# Patient Record
Sex: Male | Born: 1940 | Race: Black or African American | Hispanic: No | Smoking: Current every day smoker
Health system: Southern US, Community
[De-identification: ages and names within clinical notes are randomized; demographics above are authoritative.]

## PROBLEM LIST (undated history)

## (undated) DIAGNOSIS — M109 Gout, unspecified: Secondary | ICD-10-CM

## (undated) DIAGNOSIS — F1020 Alcohol dependence, uncomplicated: Secondary | ICD-10-CM

## (undated) DIAGNOSIS — I10 Essential (primary) hypertension: Secondary | ICD-10-CM

## (undated) DIAGNOSIS — N289 Disorder of kidney and ureter, unspecified: Secondary | ICD-10-CM

## (undated) HISTORY — DX: Disorder of kidney and ureter, unspecified: N28.9

## (undated) HISTORY — DX: Alcohol dependence, uncomplicated: F10.20

## (undated) HISTORY — DX: Essential (primary) hypertension: I10

---

## 2008-06-30 ENCOUNTER — Emergency Department (HOSPITAL_COMMUNITY): Admission: EM | Admit: 2008-06-30 | Discharge: 2008-06-30 | Payer: Self-pay | Admitting: Emergency Medicine

## 2011-09-01 DIAGNOSIS — M109 Gout, unspecified: Secondary | ICD-10-CM | POA: Insufficient documentation

## 2011-09-01 DIAGNOSIS — F17200 Nicotine dependence, unspecified, uncomplicated: Secondary | ICD-10-CM | POA: Insufficient documentation

## 2011-09-02 ENCOUNTER — Emergency Department (HOSPITAL_COMMUNITY)
Admission: RE | Admit: 2011-09-02 | Discharge: 2011-09-02 | Disposition: A | Discharge status: Home / Self Care | Payer: Medicare Other | Source: Ambulatory Visit | Attending: Emergency Medicine | Admitting: Emergency Medicine

## 2011-09-02 ENCOUNTER — Emergency Department (HOSPITAL_COMMUNITY)
Admission: EM | Admit: 2011-09-02 | Discharge: 2011-09-02 | Disposition: A | Discharge status: Home / Self Care | Payer: Medicare Other | Attending: Emergency Medicine | Admitting: Emergency Medicine

## 2011-09-02 ENCOUNTER — Other Ambulatory Visit (HOSPITAL_COMMUNITY): Payer: Self-pay | Admitting: Emergency Medicine

## 2011-09-02 DIAGNOSIS — R52 Pain, unspecified: Secondary | ICD-10-CM

## 2011-09-02 LAB — CBC WITH DIFFERENTIAL/PLATELET
Basophils Absolute: 0.1 10*3/uL (ref 0.0–0.1)
Basophils Relative: 1 % (ref 0–1)
Eosinophils Absolute: 0.3 10*3/uL (ref 0.0–0.7)
Hemoglobin: 15.2 g/dL (ref 13.0–17.0)
MCH: 28 pg (ref 26.0–34.0)
MCHC: 34.5 g/dL (ref 30.0–36.0)
Monocytes Absolute: 0.4 10*3/uL (ref 0.1–1.0)
Monocytes Relative: 5 % (ref 3–12)
Neutrophils Relative %: 72 % (ref 43–77)

## 2011-09-02 LAB — URIC ACID: Uric Acid, Serum: 8.1 mg/dL — ABNORMAL HIGH (ref 4.0–7.8)

## 2011-09-02 MED FILL — Ketorolac Tromethamine IM Inj 60 MG/2ML (30 MG/ML): INTRAMUSCULAR | Qty: 2 | Status: AC

## 2011-09-02 NOTE — ED Notes (Signed)
Refer to downtime documentation.

## 2012-04-04 ENCOUNTER — Emergency Department (HOSPITAL_COMMUNITY): Payer: Medicare Other

## 2012-04-04 ENCOUNTER — Emergency Department (HOSPITAL_COMMUNITY)
Admission: EM | Admit: 2012-04-04 | Discharge: 2012-04-04 | Disposition: A | Discharge status: Home / Self Care | Payer: Medicare Other | Attending: Emergency Medicine | Admitting: Emergency Medicine

## 2012-04-04 ENCOUNTER — Encounter (HOSPITAL_COMMUNITY): Payer: Self-pay | Admitting: *Deleted

## 2012-04-04 DIAGNOSIS — M109 Gout, unspecified: Secondary | ICD-10-CM | POA: Insufficient documentation

## 2012-04-04 DIAGNOSIS — F17200 Nicotine dependence, unspecified, uncomplicated: Secondary | ICD-10-CM | POA: Insufficient documentation

## 2012-04-04 DIAGNOSIS — IMO0001 Reserved for inherently not codable concepts without codable children: Secondary | ICD-10-CM | POA: Insufficient documentation

## 2012-04-04 DIAGNOSIS — M25539 Pain in unspecified wrist: Secondary | ICD-10-CM

## 2012-04-04 HISTORY — DX: Gout, unspecified: M10.9

## 2012-04-04 LAB — BASIC METABOLIC PANEL
BUN: 7 mg/dL (ref 6–23)
Chloride: 91 mEq/L — ABNORMAL LOW (ref 96–112)
GFR calc Af Amer: 87 mL/min — ABNORMAL LOW (ref >90)
GFR calc non Af Amer: 75 mL/min — ABNORMAL LOW (ref >90)
Potassium: 4.1 mEq/L (ref 3.5–5.1)
Sodium: 130 mEq/L — ABNORMAL LOW (ref 135–145)

## 2012-04-04 LAB — CBC
HCT: 43.3 % (ref 39.0–52.0)
Hemoglobin: 15.2 g/dL (ref 13.0–17.0)
RBC: 5.21 MIL/uL (ref 4.22–5.81)
WBC: 7.8 10*3/uL (ref 4.0–10.5)

## 2012-04-04 MED ORDER — OXYCODONE-ACETAMINOPHEN 5-325 MG PO TABS
2.0000 | ORAL_TABLET | Freq: Once | ORAL | Status: AC
  Administered 2012-04-04: 2 via ORAL
  Filled 2012-04-04: qty 2

## 2012-04-04 MED ORDER — OXYCODONE-ACETAMINOPHEN 5-325 MG PO TABS: 2.0000 | ORAL_TABLET | ORAL | Status: DC | PRN

## 2012-04-04 MED ORDER — IBUPROFEN 800 MG PO TABS: 800.0000 mg | ORAL_TABLET | Freq: Three times a day (TID) | ORAL | Status: DC

## 2012-04-04 NOTE — ED Notes (Signed)
Patient with no complaints at this time. Respirations even and unlabored. Skin warm/dry. Discharge instructions reviewed with patient at this time. Patient given opportunity to voice concerns/ask questions. Patient discharged at this time and left Emergency Department with steady gait.   

## 2012-04-04 NOTE — ED Notes (Signed)
Pt c/o right wrist pain that started a few days ago, denies any injury,. Has hx of gout,

## 2012-04-04 NOTE — ED Provider Notes (Signed)
History     CSN: [id number]  Arrival date & time 04/04/12  1336   First MD Initiated Contact with Patient 04/04/12 1355      Chief Complaint  Patient presents with  . Wrist Pain    (Consider location/radiation/quality/duration/timing/severity/associated sxs/prior treatment) HPI Comments: Patient complains of pain in his dorsal right wrist for the past several days. States he hit it on a table a few days ago but did not start hurting until last night. His pain with range of motion of the wrist. Denies any fever, chills, nausea, vomiting, weakness, numbness or tingling. Is a questionable history of gout. Denies any history of hypertension that is significantly hypertensive today. Denies any other joint pain. Denies any chest pain, shortness of breath, nausea vomiting.  The history is provided by the patient.    Past Medical History  Diagnosis Date  . Gout     History reviewed. No pertinent past surgical history.  No family history on file.  History  Substance Use Topics  . Smoking status: Current Every Day Smoker  . Smokeless tobacco: Not on file  . Alcohol Use: No      Review of Systems  Constitutional: Negative for fever, activity change and appetite change.  HENT: Negative for congestion and rhinorrhea.   Respiratory: Negative for cough, chest tightness and shortness of breath.   Cardiovascular: Negative for chest pain.  Gastrointestinal: Negative for nausea, vomiting and abdominal pain.  Genitourinary: Negative for dysuria.  Musculoskeletal: Positive for myalgias and arthralgias.  Skin: Negative for pallor.  Neurological: Negative for dizziness and headaches.  A complete 10 system review of systems was obtained and all systems are negative except as noted in the HPI and PMH.    Allergies  Review of patient's allergies indicates no known allergies.  Home Medications   Current Outpatient Rx  Name  Route  Sig  Dispense  Refill  . Aspirin-Acetaminophen (GOODY  BODY PAIN) 500-325 MG PACK   Oral   Take 1 packet by mouth daily as needed (pain).         Marland Kitchen ibuprofen (ADVIL,MOTRIN) 800 MG tablet   Oral   Take 1 tablet (800 mg total) by mouth 3 (three) times daily.   21 tablet   0   . oxyCODONE-acetaminophen (PERCOCET/ROXICET) 5-325 MG per tablet   Oral   Take 2 tablets by mouth every 4 (four) hours as needed for pain.   15 tablet   0     BP 161/91  Pulse 74  Temp(Src) 98.2 F (36.8 C)  Resp 17  Ht 5\' 10"  (1.778 m)  Wt 160 lb (72.576 kg)  BMI 22.96 kg/m2  SpO2 96%  Physical Exam  Constitutional: He is oriented to person, place, and time. He appears well-developed and well-nourished. No distress.  HENT:  Head: Normocephalic and atraumatic.  Mouth/Throat: Oropharynx is clear and moist. No oropharyngeal exudate.  Eyes: Conjunctivae and EOM are normal. Pupils are equal, round, and reactive to light.  Neck: Normal range of motion. Neck supple.  Cardiovascular: Normal rate, regular rhythm and normal heart sounds.   Pulmonary/Chest: Effort normal and breath sounds normal. No respiratory distress.  Abdominal: Soft. There is no tenderness. There is no rebound and no guarding.  Musculoskeletal: Normal range of motion. He exhibits tenderness.  Mild erythema and swelling to right dorsal wrist. Pain with extension and flexion. +2 radial pulse, cardinal hand movements are intact. Mild warmth.  Neurological: He is alert and oriented to person, place, and time. No  cranial nerve deficit. He exhibits normal muscle tone. Coordination normal.  Skin: Skin is warm.    ED Course  Procedures (including critical care time)  Labs Reviewed  BASIC METABOLIC PANEL - Abnormal; Notable for the following:    Sodium 130 (*)    Chloride 91 (*)    Glucose, Bld 132 (*)    GFR calc non Af Amer 75 (*)    GFR calc Af Amer 87 (*)    All other components within normal limits  URIC ACID  CBC   Dg Wrist Complete Right  04/04/2012  *RADIOLOGY REPORT*  Clinical  Data: Right wrist pain.  RIGHT WRIST - COMPLETE 3+ VIEW  Comparison: 06/30/2008  Findings: No acute fracture or dislocation is identified. 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  No soft tissue abnormalities are identified.  IMPRESSION: No acute fracture.  Stable degenerative changes of the wrist.   Original Report Authenticated By: Irish Lack, M.D.      1. Wrist pain   2. Gout       MDM  Right wrist pain and swelling with warmth. Suspect gout. Afebrile.  Range of motion improved after pain medication. Blood pressure improved to 160/90 on recheck. X-ray shows degenerative changes.  Explained that gout or septic joint can only be diagnosed with joint aspiration. He declines this and says he is feeling better. I do not suspect septic joint as patient has good range of motion of the wrist it is not hot it is warm and somewhat red. I explained we have not rule out septic joint but it remains a slight possibility. He'll return with worsening symptoms. Given referral to orthopedics.      Glynn Octave, MD 04/04/12 [phone]

## 2015-03-16 ENCOUNTER — Encounter (HOSPITAL_COMMUNITY): Payer: Self-pay | Admitting: Emergency Medicine

## 2015-03-16 ENCOUNTER — Emergency Department (HOSPITAL_COMMUNITY): Payer: Medicare Other

## 2015-03-16 ENCOUNTER — Emergency Department (HOSPITAL_COMMUNITY)
Admission: EM | Admit: 2015-03-16 | Discharge: 2015-03-16 | Disposition: A | Discharge status: Home / Self Care | Payer: Medicare Other | Attending: Emergency Medicine | Admitting: Emergency Medicine

## 2015-03-16 DIAGNOSIS — Z8739 Personal history of other diseases of the musculoskeletal system and connective tissue: Secondary | ICD-10-CM | POA: Insufficient documentation

## 2015-03-16 DIAGNOSIS — R531 Weakness: Secondary | ICD-10-CM | POA: Insufficient documentation

## 2015-03-16 DIAGNOSIS — S199XXA Unspecified injury of neck, initial encounter: Secondary | ICD-10-CM | POA: Diagnosis not present

## 2015-03-16 DIAGNOSIS — R404 Transient alteration of awareness: Secondary | ICD-10-CM | POA: Diagnosis not present

## 2015-03-16 DIAGNOSIS — R296 Repeated falls: Secondary | ICD-10-CM | POA: Diagnosis not present

## 2015-03-16 DIAGNOSIS — Y998 Other external cause status: Secondary | ICD-10-CM | POA: Diagnosis not present

## 2015-03-16 DIAGNOSIS — S80212A Abrasion, left knee, initial encounter: Secondary | ICD-10-CM | POA: Diagnosis not present

## 2015-03-16 DIAGNOSIS — W07XXXA Fall from chair, initial encounter: Secondary | ICD-10-CM | POA: Diagnosis not present

## 2015-03-16 DIAGNOSIS — Z23 Encounter for immunization: Secondary | ICD-10-CM | POA: Diagnosis not present

## 2015-03-16 DIAGNOSIS — S80211A Abrasion, right knee, initial encounter: Secondary | ICD-10-CM | POA: Diagnosis not present

## 2015-03-16 DIAGNOSIS — Z7982 Long term (current) use of aspirin: Secondary | ICD-10-CM | POA: Insufficient documentation

## 2015-03-16 DIAGNOSIS — L409 Psoriasis, unspecified: Secondary | ICD-10-CM | POA: Diagnosis not present

## 2015-03-16 DIAGNOSIS — Y9389 Activity, other specified: Secondary | ICD-10-CM | POA: Insufficient documentation

## 2015-03-16 DIAGNOSIS — W19XXXA Unspecified fall, initial encounter: Secondary | ICD-10-CM

## 2015-03-16 DIAGNOSIS — F1010 Alcohol abuse, uncomplicated: Secondary | ICD-10-CM | POA: Diagnosis not present

## 2015-03-16 DIAGNOSIS — Y9289 Other specified places as the place of occurrence of the external cause: Secondary | ICD-10-CM | POA: Insufficient documentation

## 2015-03-16 DIAGNOSIS — F17210 Nicotine dependence, cigarettes, uncomplicated: Secondary | ICD-10-CM | POA: Insufficient documentation

## 2015-03-16 DIAGNOSIS — S8991XA Unspecified injury of right lower leg, initial encounter: Secondary | ICD-10-CM | POA: Diagnosis present

## 2015-03-16 DIAGNOSIS — S0990XA Unspecified injury of head, initial encounter: Secondary | ICD-10-CM | POA: Diagnosis not present

## 2015-03-16 LAB — COMPREHENSIVE METABOLIC PANEL
ALBUMIN: 3 g/dL — AB (ref 3.5–5.0)
ALK PHOS: 72 U/L (ref 38–126)
ALT: 18 U/L (ref 17–63)
AST: 43 U/L — ABNORMAL HIGH (ref 15–41)
Anion gap: 9 (ref 5–15)
BUN: 10 mg/dL (ref 6–20)
CALCIUM: 8.5 mg/dL — AB (ref 8.9–10.3)
CO2: 24 mmol/L (ref 22–32)
CREATININE: 1.01 mg/dL (ref 0.61–1.24)
Chloride: 95 mmol/L — ABNORMAL LOW (ref 101–111)
GFR calc Af Amer: >60 mL/min (ref >60)
GFR calc non Af Amer: >60 mL/min (ref >60)
GLUCOSE: 85 mg/dL (ref 65–99)
Potassium: 4.4 mmol/L (ref 3.5–5.1)
SODIUM: 128 mmol/L — AB (ref 135–145)
Total Bilirubin: 0.5 mg/dL (ref 0.3–1.2)
Total Protein: 8.3 g/dL — ABNORMAL HIGH (ref 6.5–8.1)

## 2015-03-16 LAB — URINALYSIS, ROUTINE W REFLEX MICROSCOPIC
BILIRUBIN URINE: NEGATIVE
GLUCOSE, UA: NEGATIVE mg/dL
KETONES UR: NEGATIVE mg/dL
Leukocytes, UA: NEGATIVE
Nitrite: NEGATIVE
PH: 5.5 (ref 5.0–8.0)
Protein, ur: NEGATIVE mg/dL
Specific Gravity, Urine: <1.005 — ABNORMAL LOW (ref 1.005–1.030)

## 2015-03-16 LAB — CBC WITH DIFFERENTIAL/PLATELET
BASOS PCT: 2 %
Basophils Absolute: 0.1 10*3/uL (ref 0.0–0.1)
Eosinophils Absolute: 0.2 10*3/uL (ref 0.0–0.7)
Eosinophils Relative: 5 %
HEMATOCRIT: 34.5 % — AB (ref 39.0–52.0)
HEMOGLOBIN: 11.9 g/dL — AB (ref 13.0–17.0)
Lymphocytes Relative: 38 %
Lymphs Abs: 1.9 10*3/uL (ref 0.7–4.0)
MCH: 29.5 pg (ref 26.0–34.0)
MCHC: 34.5 g/dL (ref 30.0–36.0)
MCV: 85.6 fL (ref 78.0–100.0)
MONOS PCT: 4 %
Monocytes Absolute: 0.2 10*3/uL (ref 0.1–1.0)
NEUTROS ABS: 2.6 10*3/uL (ref 1.7–7.7)
NEUTROS PCT: 51 %
Platelets: 187 10*3/uL (ref 150–400)
RBC: 4.03 MIL/uL — ABNORMAL LOW (ref 4.22–5.81)
RDW: 14.5 % (ref 11.5–15.5)
WBC: 5 10*3/uL (ref 4.0–10.5)

## 2015-03-16 LAB — URINE MICROSCOPIC-ADD ON

## 2015-03-16 LAB — ETHANOL: Alcohol, Ethyl (B): 257 mg/dL — ABNORMAL HIGH (ref <5)

## 2015-03-16 MED ORDER — TETANUS-DIPHTH-ACELL PERTUSSIS 5-2.5-18.5 LF-MCG/0.5 IM SUSP
0.5000 mL | Freq: Once | INTRAMUSCULAR | Status: AC
  Administered 2015-03-16: 0.5 mL via INTRAMUSCULAR
  Filled 2015-03-16: qty 0.5

## 2015-03-16 MED ORDER — SODIUM CHLORIDE 0.9 % IV BOLUS (SEPSIS)
1000.0000 mL | Freq: Once | INTRAVENOUS | Status: AC
Start: 2015-03-16 — End: 2015-03-16
  Administered 2015-03-16: 1000 mL via INTRAVENOUS

## 2015-03-16 MED ORDER — CHLORDIAZEPOXIDE HCL 25 MG PO CAPS: ORAL_CAPSULE | ORAL | Status: DC

## 2015-03-16 NOTE — ED Notes (Signed)
Pt got out of bed without calling and walked to restroom, pt. Urinated on floor and slipped up on urine walking out of bathroom. Pt. Was witnessed to be sitting on floor after fall, denies hitting head or any pain. VSS. EDP notifed. Bed alarm applied to bed, yellow socks and yellow armband applied. Family at bedside currently.

## 2015-03-16 NOTE — ED Notes (Signed)
Pt notified of need of urine specimen. Urinal at bedside. Patient instructed to call.

## 2015-03-16 NOTE — ED Notes (Signed)
Pt brought by ems for evaluation of intoxication and a fall.  CBG 94 per ems.  Pt drinks daily and lives alone and has been falling frequently.  Has multiple sores on body that resemble burns.  Smells of urine.

## 2015-03-16 NOTE — ED Provider Notes (Signed)
CSN: [id number]     Arrival date & time 03/16/15  1035 History  By signing my name below, I, Terressa Koyanagi, attest that this documentation has been prepared under the direction and in the presence of Milton Ferguson, MD. Electronically Signed: Terressa Koyanagi, ED Scribe. 03/16/2015. 11:09 AM.  Chief Complaint  Patient presents with  . Alcohol Intoxication  . Fall   Patient is a 75 y.o. male presenting with fall. The history is provided by the patient. No language interpreter was used.  Fall This is a new problem. The current episode started 1 to 2 hours ago. Pertinent negatives include no chest pain, no abdominal pain, no headaches and no shortness of breath.   PCP: No primary care provider on file. HPI Comments: Anthony Burton is a 75 y.o. male, with PMHx noted below including daily EtOH use, accompanied by his niece, who presents to the Emergency Department via EMS complaining of a fall earlier today whereby pt fell off a chair onto the floor, hitting his head. Pt denies LOC. Pt also complains of intermittent, generalized weakness onset months ago.   Past Medical History  Diagnosis Date  . Gout    History reviewed. No pertinent past surgical history. History reviewed. No pertinent family history. Social History  Substance Use Topics  . Smoking status: Current Every Day Smoker    Types: Cigarettes  . Smokeless tobacco: None  . Alcohol Use: Yes     Comment: daily use    Review of Systems  Constitutional: Negative for appetite change and fatigue.  HENT: Negative for congestion, ear discharge and sinus pressure.   Eyes: Negative for discharge.  Respiratory: Negative for cough and shortness of breath.   Cardiovascular: Negative for chest pain.  Gastrointestinal: Negative for abdominal pain and diarrhea.  Genitourinary: Negative for frequency and hematuria.  Musculoskeletal: Negative for back pain.  Skin: Positive for wound (abrasion to bilateral knees). Negative for rash.   Neurological: Positive for weakness. Negative for seizures and headaches.  Psychiatric/Behavioral: Negative for hallucinations.   Allergies  Review of patient's allergies indicates no known allergies.  Home Medications   Prior to Admission medications   Medication Sig Start Date End Date Taking? Authorizing Provider  aspirin 325 MG tablet Take 650 mg by mouth daily.   Yes Historical Provider, MD  Aspirin-Acetaminophen (GOODY BODY PAIN) 500-325 MG PACK Take 1 packet by mouth daily as needed (pain).   Yes Historical Provider, MD   Triage Vitals: BP 152/97 mmHg  Pulse 73  Temp(Src) 97.6 F (36.4 C) (Oral)  Resp 18  Ht 6' (1.829 m)  Wt 160 lb (72.576 kg)  BMI 21.70 kg/m2  SpO2 100% Physical Exam  Constitutional: He is oriented to person, place, and time. He appears well-developed.  HENT:  Head: Normocephalic.  Eyes: Conjunctivae and EOM are normal. No scleral icterus.  Neck: Neck supple. No thyromegaly present.  Cardiovascular: Normal rate and regular rhythm.  Exam reveals no gallop and no friction rub.   No murmur heard. Pulmonary/Chest: No stridor. He has no wheezes. He has no rales. He exhibits no tenderness.  Abdominal: He exhibits no distension. There is no tenderness. There is no rebound.  Musculoskeletal: Normal range of motion. He exhibits no edema.  Lymphadenopathy:    He has no cervical adenopathy.  Neurological: He is oriented to person, place, and time. He exhibits normal muscle tone. Coordination normal.  Skin:  psoriasis bilateral arms, abrasion to bilateral knees.   Psychiatric: He has a normal mood and  affect. His behavior is normal.    ED Course  Procedures (including critical care time) DIAGNOSTIC STUDIES: Oxygen Saturation is 100% on ra, nl by my interpretation.    COORDINATION OF CARE: 11:04 AM: Discussed treatment plan with pt at bedside; patient verbalizes understanding and agrees with treatment plan.  Labs Review Labs Reviewed - No data to  display  Imaging Review No results found. I have personally reviewed and evaluated these images and lab results as part of my medical decision-making.   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 Patient was instructed to stop drinking alcohol. He is given a prescription of Librium. Is also referred to [facility] his 2 relatives with [facility] and agreed they would help take care of him   The chart was scribed for me under my direct supervision.  I personally performed the history, physical, and medical decision making and all procedures in the evaluation of this patient.Milton Ferguson, MD 03/16/15 1435

## 2015-03-16 NOTE — Discharge Instructions (Signed)
Follow up with [facility] next week.  Stop drinking alcohol

## 2015-03-16 NOTE — ED Notes (Signed)
Pt d/c papers/prescriptions given and reviewed with patient and family. Patient/family verbalized understanding. Patient d/c via wheelchair in care of family.

## 2015-10-31 DIAGNOSIS — E669 Obesity, unspecified: Secondary | ICD-10-CM | POA: Diagnosis not present

## 2015-10-31 DIAGNOSIS — M109 Gout, unspecified: Secondary | ICD-10-CM | POA: Diagnosis not present

## 2015-10-31 DIAGNOSIS — R799 Abnormal finding of blood chemistry, unspecified: Secondary | ICD-10-CM | POA: Diagnosis not present

## 2015-10-31 DIAGNOSIS — L989 Disorder of the skin and subcutaneous tissue, unspecified: Secondary | ICD-10-CM | POA: Diagnosis not present

## 2015-10-31 DIAGNOSIS — Z79899 Other long term (current) drug therapy: Secondary | ICD-10-CM | POA: Diagnosis not present

## 2015-10-31 DIAGNOSIS — I10 Essential (primary) hypertension: Secondary | ICD-10-CM | POA: Diagnosis not present

## 2015-10-31 DIAGNOSIS — Z0000 Encounter for general adult medical examination without abnormal findings: Secondary | ICD-10-CM | POA: Diagnosis not present

## 2015-11-08 DIAGNOSIS — L120 Bullous pemphigoid: Secondary | ICD-10-CM | POA: Diagnosis not present

## 2015-11-14 DIAGNOSIS — F17200 Nicotine dependence, unspecified, uncomplicated: Secondary | ICD-10-CM | POA: Diagnosis not present

## 2015-11-14 DIAGNOSIS — M109 Gout, unspecified: Secondary | ICD-10-CM | POA: Diagnosis not present

## 2015-11-14 DIAGNOSIS — I10 Essential (primary) hypertension: Secondary | ICD-10-CM | POA: Diagnosis not present

## 2015-11-14 DIAGNOSIS — L120 Bullous pemphigoid: Secondary | ICD-10-CM | POA: Diagnosis not present

## 2015-11-14 DIAGNOSIS — Z23 Encounter for immunization: Secondary | ICD-10-CM | POA: Diagnosis not present

## 2017-03-16 ENCOUNTER — Inpatient Hospital Stay (HOSPITAL_COMMUNITY)
Admission: EM | Admit: 2017-03-16 | Discharge: 2017-03-20 | DRG: 641 | Disposition: A | Discharge status: Home / Self Care | Payer: Medicare Other | Attending: Family Medicine | Admitting: Family Medicine

## 2017-03-16 ENCOUNTER — Emergency Department (HOSPITAL_COMMUNITY): Payer: Medicare Other

## 2017-03-16 ENCOUNTER — Encounter (HOSPITAL_COMMUNITY): Payer: Self-pay | Admitting: Emergency Medicine

## 2017-03-16 DIAGNOSIS — R404 Transient alteration of awareness: Secondary | ICD-10-CM | POA: Diagnosis not present

## 2017-03-16 DIAGNOSIS — S0990XA Unspecified injury of head, initial encounter: Secondary | ICD-10-CM | POA: Diagnosis not present

## 2017-03-16 DIAGNOSIS — E871 Hypo-osmolality and hyponatremia: Secondary | ICD-10-CM | POA: Diagnosis not present

## 2017-03-16 DIAGNOSIS — S065X9A Traumatic subdural hemorrhage with loss of consciousness of unspecified duration, initial encounter: Secondary | ICD-10-CM

## 2017-03-16 DIAGNOSIS — R918 Other nonspecific abnormal finding of lung field: Secondary | ICD-10-CM | POA: Diagnosis not present

## 2017-03-16 DIAGNOSIS — F1027 Alcohol dependence with alcohol-induced persisting dementia: Secondary | ICD-10-CM | POA: Diagnosis present

## 2017-03-16 DIAGNOSIS — R7989 Other specified abnormal findings of blood chemistry: Secondary | ICD-10-CM

## 2017-03-16 DIAGNOSIS — N390 Urinary tract infection, site not specified: Secondary | ICD-10-CM | POA: Diagnosis present

## 2017-03-16 DIAGNOSIS — E860 Dehydration: Secondary | ICD-10-CM | POA: Diagnosis present

## 2017-03-16 DIAGNOSIS — Z23 Encounter for immunization: Secondary | ICD-10-CM

## 2017-03-16 DIAGNOSIS — J320 Chronic maxillary sinusitis: Secondary | ICD-10-CM

## 2017-03-16 DIAGNOSIS — R531 Weakness: Secondary | ICD-10-CM | POA: Diagnosis not present

## 2017-03-16 DIAGNOSIS — Z452 Encounter for adjustment and management of vascular access device: Secondary | ICD-10-CM

## 2017-03-16 DIAGNOSIS — Z515 Encounter for palliative care: Secondary | ICD-10-CM

## 2017-03-16 DIAGNOSIS — D649 Anemia, unspecified: Secondary | ICD-10-CM | POA: Diagnosis present

## 2017-03-16 DIAGNOSIS — M109 Gout, unspecified: Secondary | ICD-10-CM

## 2017-03-16 DIAGNOSIS — F0390 Unspecified dementia without behavioral disturbance: Secondary | ICD-10-CM | POA: Diagnosis present

## 2017-03-16 DIAGNOSIS — E876 Hypokalemia: Secondary | ICD-10-CM | POA: Diagnosis not present

## 2017-03-16 DIAGNOSIS — S065XAA Traumatic subdural hemorrhage with loss of consciousness status unknown, initial encounter: Secondary | ICD-10-CM

## 2017-03-16 DIAGNOSIS — F17210 Nicotine dependence, cigarettes, uncomplicated: Secondary | ICD-10-CM | POA: Diagnosis present

## 2017-03-16 DIAGNOSIS — Z95828 Presence of other vascular implants and grafts: Secondary | ICD-10-CM

## 2017-03-16 DIAGNOSIS — R296 Repeated falls: Secondary | ICD-10-CM

## 2017-03-16 DIAGNOSIS — Z7982 Long term (current) use of aspirin: Secondary | ICD-10-CM

## 2017-03-16 DIAGNOSIS — R778 Other specified abnormalities of plasma proteins: Secondary | ICD-10-CM

## 2017-03-16 DIAGNOSIS — Z681 Body mass index (BMI) 19 or less, adult: Secondary | ICD-10-CM

## 2017-03-16 DIAGNOSIS — E440 Moderate protein-calorie malnutrition: Secondary | ICD-10-CM

## 2017-03-16 DIAGNOSIS — E162 Hypoglycemia, unspecified: Secondary | ICD-10-CM | POA: Diagnosis not present

## 2017-03-16 DIAGNOSIS — R64 Cachexia: Secondary | ICD-10-CM | POA: Diagnosis present

## 2017-03-16 DIAGNOSIS — W19XXXA Unspecified fall, initial encounter: Secondary | ICD-10-CM

## 2017-03-16 DIAGNOSIS — F1010 Alcohol abuse, uncomplicated: Secondary | ICD-10-CM

## 2017-03-16 DIAGNOSIS — S0093XS Contusion of unspecified part of head, sequela: Secondary | ICD-10-CM

## 2017-03-16 LAB — TROPONIN I: Troponin I: 0.11 ng/mL (ref <0.03)

## 2017-03-16 LAB — COMPREHENSIVE METABOLIC PANEL
ALBUMIN: 3.4 g/dL — AB (ref 3.5–5.0)
ALT: 19 U/L (ref 17–63)
AST: 93 U/L — AB (ref 15–41)
Alkaline Phosphatase: 73 U/L (ref 38–126)
Anion gap: 13 (ref 5–15)
BUN: 14 mg/dL (ref 6–20)
CHLORIDE: 74 mmol/L — AB (ref 101–111)
CO2: 23 mmol/L (ref 22–32)
CREATININE: 1.18 mg/dL (ref 0.61–1.24)
Calcium: 8.9 mg/dL (ref 8.9–10.3)
GFR calc non Af Amer: 59 mL/min — ABNORMAL LOW (ref >60)
GLUCOSE: 63 mg/dL — AB (ref 65–99)
Potassium: 4.9 mmol/L (ref 3.5–5.1)
SODIUM: 110 mmol/L — AB (ref 135–145)
Total Bilirubin: 1.5 mg/dL — ABNORMAL HIGH (ref 0.3–1.2)
Total Protein: 7.3 g/dL (ref 6.5–8.1)

## 2017-03-16 LAB — TSH: TSH: 1.411 u[IU]/mL (ref 0.350–4.500)

## 2017-03-16 LAB — CBG MONITORING, ED
GLUCOSE-CAPILLARY: 96 mg/dL (ref 65–99)
Glucose-Capillary: 57 mg/dL — ABNORMAL LOW (ref 65–99)

## 2017-03-16 LAB — CBC WITH DIFFERENTIAL/PLATELET
Basophils Absolute: 0 10*3/uL (ref 0.0–0.1)
Basophils Relative: 0 %
EOS PCT: 0 %
Eosinophils Absolute: 0 10*3/uL (ref 0.0–0.7)
HCT: 32 % — ABNORMAL LOW (ref 39.0–52.0)
Hemoglobin: 11.3 g/dL — ABNORMAL LOW (ref 13.0–17.0)
LYMPHS ABS: 0.9 10*3/uL (ref 0.7–4.0)
LYMPHS PCT: 14 %
MCH: 29 pg (ref 26.0–34.0)
MCHC: 35.3 g/dL (ref 30.0–36.0)
MCV: 82.3 fL (ref 78.0–100.0)
MONOS PCT: 9 %
Monocytes Absolute: 0.6 10*3/uL (ref 0.1–1.0)
Neutro Abs: 4.9 10*3/uL (ref 1.7–7.7)
Neutrophils Relative %: 77 %
PLATELETS: 161 10*3/uL (ref 150–400)
RBC: 3.89 MIL/uL — AB (ref 4.22–5.81)
RDW: 14.1 % (ref 11.5–15.5)
WBC: 6.4 10*3/uL (ref 4.0–10.5)

## 2017-03-16 LAB — ETHANOL: Alcohol, Ethyl (B): <10 mg/dL (ref <10)

## 2017-03-16 NOTE — ED Notes (Signed)
Troponin 0.11. EDP notified.

## 2017-03-16 NOTE — ED Provider Notes (Signed)
[facility] EMERGENCY DEPARTMENT Provider Note   CSN: [id number] Arrival date & time: 03/16/17  2134     History   Chief Complaint Chief Complaint  Patient presents with  . Fall   Level 5 caveat due to altered mental status. HPI Anthony Burton is a 77 y.o. male.  HPI Patient presents with fall and generalized weakness.  Reportedly was too weak to be able to get up on his own.  Patient lives alone and reportedly been doing worse recently.  Per family is a heavy drinker.  Patient states he drinks occasionally.  Covered in feces and urine.Reportedly has had decreased oral intake.  Denies cough.  Denies dysuria.  Per family members has had a weight loss.  Patient is somewhat difficult to get a history from.  At this apparent mild confusion. Past Medical History:  Diagnosis Date  . Gout     There are no active problems to display for this patient.   History reviewed. No pertinent surgical history.     Home Medications    Prior to Admission medications   Medication Sig Start Date End Date Taking? Authorizing Provider  aspirin 325 MG tablet Take 650 mg by mouth daily.    [provider]  Aspirin-Acetaminophen (GOODY BODY PAIN) 500-325 MG PACK Take 1 packet by mouth daily as needed (pain).    [provider]  chlordiazePOXIDE (LIBRIUM) 25 MG capsule 1 po every 8 hours for anxiety 03/16/15   Milton Ferguson, MD    Family History No family history on file.  Social History Social History   Tobacco Use  . Smoking status: Current Every Day Smoker    Types: Cigarettes  . Smokeless tobacco: Never Used  Substance Use Topics  . Alcohol use: Yes    Comment: daily use  . Drug use: No     Allergies   Patient has no known allergies.   Review of Systems Review of Systems  Unable to perform ROS: Mental status change  Constitutional: Positive for appetite change.  Respiratory: Negative for cough and shortness of breath.      Physical Exam Updated Vital  Signs BP 129/85   Pulse (!) 102   Temp 99.1 F (37.3 C) (Rectal)   Resp 19   SpO2 99%   Physical Exam  Constitutional: He appears well-developed.  Patient smells like feces.  HENT:  Head: Normocephalic and atraumatic.  Eyes: EOM are normal.  Neck: Neck supple.  Pulmonary/Chest: He has no wheezes. He has no rales.  Abdominal: There is no tenderness.  Musculoskeletal: He exhibits no edema.  Neurological: He is alert.  Awake and able to answer most questions but does have some mild confusion.  Skin: Skin is warm. Capillary refill takes less than 2 seconds.     ED Treatments / Results  Labs (all labs ordered are listed, but only abnormal results are displayed) Labs Reviewed  COMPREHENSIVE METABOLIC PANEL - Abnormal; Notable for the following components:      Result Value   Sodium 110 (*)    Chloride 74 (*)    Glucose, Bld 63 (*)    Albumin 3.4 (*)    AST 93 (*)    Total Bilirubin 1.5 (*)    GFR calc non Af Amer 59 (*)    All other components within normal limits  CBC WITH DIFFERENTIAL/PLATELET - Abnormal; Notable for the following components:   RBC 3.89 (*)    Hemoglobin 11.3 (*)    HCT 32.0 (*)  All other components within normal limits  TROPONIN I - Abnormal; Notable for the following components:   Troponin I 0.11 (*)    All other components within normal limits  CBG MONITORING, ED - Abnormal; Notable for the following components:   Glucose-Capillary 57 (*)    All other components within normal limits  ETHANOL  TSH  URINALYSIS, ROUTINE W REFLEX MICROSCOPIC  CBG MONITORING, ED  I-STAT CG4 LACTIC ACID, ED    EKG  EKG Interpretation None     ED ECG REPORT   Date: 03/17/2017  Rate: 92  Rhythm: normal sinus rhythm  QRS Axis: normal Severe artifact in multiple leads and difficult to get complete interpretation off of.  Radiology Dg Chest 2 View  Result Date: 03/16/2017 CLINICAL DATA:  Weakness EXAM: CHEST  2 VIEW COMPARISON:  03/16/2015 FINDINGS:  Hyperinflation with emphysematous changes. Mild ground-glass opacity at the left lung base. No pleural effusion. Normal heart size. Aortic atherosclerosis. No pneumothorax. IMPRESSION: 1. Hyperinflation with emphysematous disease 2. Mild left basilar opacity, possible infiltrate. Electronically Signed   By: Donavan Foil M.D.   On: 03/16/2017 23:35    Procedures Procedures (including critical care time)  Medications Ordered in ED Medications - No data to display   Initial Impression / Assessment and Plan / ED Course  I have reviewed the triage vital signs and the nursing notes.  Pertinent labs & imaging results that were available during my care of the patient were reviewed by me and considered in my medical decision making (see chart for details).     Patient presents with generalized weakness after fall.  Reportedly had too much weakness to get up.  Has reportedly been doing worse over the last week or 2 however.  Has had weight loss.  Has hyponatremia of 110.  Reportedly is a heavy drinker.  Has not had cough and does not have a white count.  Will admit to hospitalist.  May end up needing placement also.  Patient without chest pain now but reportedly had some chest pain a week ago.  Troponin mildly elevated.  A lot of artifact on EKG but does not appear ischemic.  Final Clinical Impressions(s) / ED Diagnoses   Final diagnoses:  Hyponatremia    ED Discharge Orders    None       Davonna Belling, MD 03/17/17 [phone]

## 2017-03-16 NOTE — ED Notes (Signed)
CBG-57. Pt drinking 8 OZ OJ. nad noted.

## 2017-03-16 NOTE — ED Notes (Signed)
Pt incontinent of stool and urine. Bed bath given.   Pt sister states pt lives alone and family comes in to clean for him once a week. Pt sister states she feels he can not live on his own anymore and they need to place him somewhere.

## 2017-03-16 NOTE — ED Triage Notes (Signed)
Pt brought in for fall at home. Denies pain or injury.

## 2017-03-17 ENCOUNTER — Encounter (HOSPITAL_COMMUNITY): Payer: Self-pay | Admitting: *Deleted

## 2017-03-17 ENCOUNTER — Emergency Department (HOSPITAL_COMMUNITY): Payer: Medicare Other

## 2017-03-17 ENCOUNTER — Other Ambulatory Visit: Payer: Self-pay

## 2017-03-17 ENCOUNTER — Inpatient Hospital Stay (HOSPITAL_COMMUNITY): Payer: Medicare Other

## 2017-03-17 DIAGNOSIS — E440 Moderate protein-calorie malnutrition: Secondary | ICD-10-CM | POA: Diagnosis present

## 2017-03-17 DIAGNOSIS — E876 Hypokalemia: Secondary | ICD-10-CM | POA: Diagnosis not present

## 2017-03-17 DIAGNOSIS — F1027 Alcohol dependence with alcohol-induced persisting dementia: Secondary | ICD-10-CM | POA: Diagnosis present

## 2017-03-17 DIAGNOSIS — E871 Hypo-osmolality and hyponatremia: Secondary | ICD-10-CM | POA: Diagnosis not present

## 2017-03-17 DIAGNOSIS — N390 Urinary tract infection, site not specified: Secondary | ICD-10-CM | POA: Diagnosis present

## 2017-03-17 DIAGNOSIS — S065X9A Traumatic subdural hemorrhage with loss of consciousness of unspecified duration, initial encounter: Secondary | ICD-10-CM

## 2017-03-17 DIAGNOSIS — R64 Cachexia: Secondary | ICD-10-CM | POA: Diagnosis present

## 2017-03-17 DIAGNOSIS — S0093XS Contusion of unspecified part of head, sequela: Secondary | ICD-10-CM | POA: Diagnosis not present

## 2017-03-17 DIAGNOSIS — F0390 Unspecified dementia without behavioral disturbance: Secondary | ICD-10-CM | POA: Diagnosis present

## 2017-03-17 DIAGNOSIS — S065XAA Traumatic subdural hemorrhage with loss of consciousness status unknown, initial encounter: Secondary | ICD-10-CM

## 2017-03-17 DIAGNOSIS — Z452 Encounter for adjustment and management of vascular access device: Secondary | ICD-10-CM | POA: Diagnosis not present

## 2017-03-17 DIAGNOSIS — W19XXXA Unspecified fall, initial encounter: Secondary | ICD-10-CM

## 2017-03-17 DIAGNOSIS — M109 Gout, unspecified: Secondary | ICD-10-CM | POA: Diagnosis present

## 2017-03-17 DIAGNOSIS — R531 Weakness: Secondary | ICD-10-CM

## 2017-03-17 DIAGNOSIS — R7989 Other specified abnormal findings of blood chemistry: Secondary | ICD-10-CM

## 2017-03-17 DIAGNOSIS — F1010 Alcohol abuse, uncomplicated: Secondary | ICD-10-CM | POA: Diagnosis not present

## 2017-03-17 DIAGNOSIS — Z681 Body mass index (BMI) 19 or less, adult: Secondary | ICD-10-CM | POA: Diagnosis not present

## 2017-03-17 DIAGNOSIS — F17210 Nicotine dependence, cigarettes, uncomplicated: Secondary | ICD-10-CM | POA: Diagnosis present

## 2017-03-17 DIAGNOSIS — E162 Hypoglycemia, unspecified: Secondary | ICD-10-CM | POA: Diagnosis not present

## 2017-03-17 DIAGNOSIS — Z23 Encounter for immunization: Secondary | ICD-10-CM | POA: Diagnosis not present

## 2017-03-17 DIAGNOSIS — J320 Chronic maxillary sinusitis: Secondary | ICD-10-CM | POA: Diagnosis present

## 2017-03-17 DIAGNOSIS — Z515 Encounter for palliative care: Secondary | ICD-10-CM | POA: Diagnosis not present

## 2017-03-17 DIAGNOSIS — D649 Anemia, unspecified: Secondary | ICD-10-CM | POA: Diagnosis present

## 2017-03-17 DIAGNOSIS — R296 Repeated falls: Secondary | ICD-10-CM | POA: Diagnosis not present

## 2017-03-17 DIAGNOSIS — R778 Other specified abnormalities of plasma proteins: Secondary | ICD-10-CM

## 2017-03-17 DIAGNOSIS — Z7982 Long term (current) use of aspirin: Secondary | ICD-10-CM | POA: Diagnosis not present

## 2017-03-17 DIAGNOSIS — E860 Dehydration: Secondary | ICD-10-CM | POA: Diagnosis present

## 2017-03-17 DIAGNOSIS — S0990XA Unspecified injury of head, initial encounter: Secondary | ICD-10-CM | POA: Diagnosis not present

## 2017-03-17 LAB — BASIC METABOLIC PANEL
ANION GAP: 14 (ref 5–15)
ANION GAP: 15 (ref 5–15)
Anion gap: 12 (ref 5–15)
BUN: 13 mg/dL (ref 6–20)
BUN: 13 mg/dL (ref 6–20)
BUN: 11 mg/dL (ref 6–20)
CALCIUM: 9.4 mg/dL (ref 8.9–10.3)
CHLORIDE: 82 mmol/L — AB (ref 101–111)
CO2: 22 mmol/L (ref 22–32)
CO2: 21 mmol/L — ABNORMAL LOW (ref 22–32)
CO2: 23 mmol/L (ref 22–32)
CREATININE: 1.07 mg/dL (ref 0.61–1.24)
Calcium: 9 mg/dL (ref 8.9–10.3)
Calcium: 8.6 mg/dL — ABNORMAL LOW (ref 8.9–10.3)
Chloride: 76 mmol/L — ABNORMAL LOW (ref 101–111)
Chloride: 75 mmol/L — ABNORMAL LOW (ref 101–111)
Creatinine, Ser: 1.16 mg/dL (ref 0.61–1.24)
Creatinine, Ser: 1.07 mg/dL (ref 0.61–1.24)
GFR calc Af Amer: >60 mL/min (ref >60)
GLUCOSE: 65 mg/dL (ref 65–99)
GLUCOSE: 67 mg/dL (ref 65–99)
Glucose, Bld: 73 mg/dL (ref 65–99)
POTASSIUM: 3.8 mmol/L (ref 3.5–5.1)
Potassium: 4.6 mmol/L (ref 3.5–5.1)
Potassium: 4 mmol/L (ref 3.5–5.1)
SODIUM: 112 mmol/L — AB (ref 135–145)
SODIUM: 111 mmol/L — AB (ref 135–145)
SODIUM: 117 mmol/L — AB (ref 135–145)

## 2017-03-17 LAB — RENAL FUNCTION PANEL
ALBUMIN: 3.1 g/dL — AB (ref 3.5–5.0)
ANION GAP: 12 (ref 5–15)
ANION GAP: 13 (ref 5–15)
Albumin: 3.1 g/dL — ABNORMAL LOW (ref 3.5–5.0)
Albumin: 3 g/dL — ABNORMAL LOW (ref 3.5–5.0)
Albumin: 3.4 g/dL — ABNORMAL LOW (ref 3.5–5.0)
Anion gap: 13 (ref 5–15)
Anion gap: 12 (ref 5–15)
BUN: 12 mg/dL (ref 6–20)
BUN: 12 mg/dL (ref 6–20)
BUN: 12 mg/dL (ref 6–20)
BUN: 11 mg/dL (ref 6–20)
CALCIUM: 8.3 mg/dL — AB (ref 8.9–10.3)
CALCIUM: 8.7 mg/dL — AB (ref 8.9–10.3)
CHLORIDE: 77 mmol/L — AB (ref 101–111)
CO2: 22 mmol/L (ref 22–32)
CO2: 23 mmol/L (ref 22–32)
CO2: 23 mmol/L (ref 22–32)
CO2: 23 mmol/L (ref 22–32)
Calcium: 8.5 mg/dL — ABNORMAL LOW (ref 8.9–10.3)
Calcium: 9 mg/dL (ref 8.9–10.3)
Chloride: 77 mmol/L — ABNORMAL LOW (ref 101–111)
Chloride: 80 mmol/L — ABNORMAL LOW (ref 101–111)
Chloride: 81 mmol/L — ABNORMAL LOW (ref 101–111)
Creatinine, Ser: 1.15 mg/dL (ref 0.61–1.24)
Creatinine, Ser: 1.12 mg/dL (ref 0.61–1.24)
Creatinine, Ser: 1.17 mg/dL (ref 0.61–1.24)
Creatinine, Ser: 1.06 mg/dL (ref 0.61–1.24)
GFR calc Af Amer: >60 mL/min (ref >60)
GFR calc Af Amer: >60 mL/min (ref >60)
GFR calc Af Amer: >60 mL/min (ref >60)
GFR calc non Af Amer: >60 mL/min (ref >60)
GFR calc non Af Amer: 60 mL/min — ABNORMAL LOW (ref >60)
GFR calc non Af Amer: >60 mL/min (ref >60)
GLUCOSE: 94 mg/dL (ref 65–99)
GLUCOSE: 73 mg/dL (ref 65–99)
GLUCOSE: 71 mg/dL (ref 65–99)
Glucose, Bld: 82 mg/dL (ref 65–99)
PHOSPHORUS: 2.7 mg/dL (ref 2.5–4.6)
POTASSIUM: 4.6 mmol/L (ref 3.5–5.1)
Phosphorus: 2.2 mg/dL — ABNORMAL LOW (ref 2.5–4.6)
Phosphorus: 2.2 mg/dL — ABNORMAL LOW (ref 2.5–4.6)
Phosphorus: 2.7 mg/dL (ref 2.5–4.6)
Potassium: 3.7 mmol/L (ref 3.5–5.1)
Potassium: 3.8 mmol/L (ref 3.5–5.1)
Potassium: 4.1 mmol/L (ref 3.5–5.1)
SODIUM: 117 mmol/L — AB (ref 135–145)
Sodium: 112 mmol/L — CL (ref 135–145)
Sodium: 112 mmol/L — CL (ref 135–145)
Sodium: 115 mmol/L — CL (ref 135–145)

## 2017-03-17 LAB — OSMOLALITY: Osmolality: 241 mOsm/kg — CL (ref 275–295)

## 2017-03-17 LAB — URINALYSIS, ROUTINE W REFLEX MICROSCOPIC
Bilirubin Urine: NEGATIVE
GLUCOSE, UA: NEGATIVE mg/dL
Ketones, ur: 5 mg/dL — AB
Nitrite: POSITIVE — AB
PH: 6 (ref 5.0–8.0)
Protein, ur: NEGATIVE mg/dL
Specific Gravity, Urine: 1.006 (ref 1.005–1.030)

## 2017-03-17 LAB — TROPONIN I
TROPONIN I: 0.06 ng/mL — AB (ref <0.03)
Troponin I: 0.08 ng/mL (ref <0.03)
Troponin I: 0.05 ng/mL (ref <0.03)

## 2017-03-17 LAB — TSH: TSH: 1.182 u[IU]/mL (ref 0.350–4.500)

## 2017-03-17 LAB — SODIUM, URINE, RANDOM

## 2017-03-17 LAB — I-STAT CG4 LACTIC ACID, ED: Lactic Acid, Venous: 1.84 mmol/L (ref 0.5–1.9)

## 2017-03-17 LAB — OSMOLALITY, URINE: OSMOLALITY UR: 140 mosm/kg — AB (ref 300–900)

## 2017-03-17 LAB — CK: Total CK: 1193 U/L — ABNORMAL HIGH (ref 49–397)

## 2017-03-17 LAB — URIC ACID: Uric Acid, Serum: 5.6 mg/dL (ref 4.4–7.6)

## 2017-03-17 MED ORDER — LORAZEPAM 2 MG/ML IJ SOLN
0.0000 mg | Freq: Four times a day (QID) | INTRAMUSCULAR | Status: DC
  Administered 2017-03-18: 1 mg via INTRAVENOUS
  Administered 2017-03-18: 2 mg via INTRAVENOUS
  Administered 2017-03-18: 1 mg via INTRAVENOUS
  Administered 2017-03-18: 2 mg via INTRAVENOUS
  Filled 2017-03-17: qty 1
  Filled 2017-03-17: qty 2
  Filled 2017-03-17 (×3): qty 1

## 2017-03-17 MED ORDER — ADULT MULTIVITAMIN W/MINERALS CH
1.0000 | ORAL_TABLET | Freq: Every day | ORAL | Status: DC
  Administered 2017-03-17 – 2017-03-20 (×4): 1 via ORAL
  Filled 2017-03-17 (×4): qty 1

## 2017-03-17 MED ORDER — VITAMIN B-1 100 MG PO TABS
100.0000 mg | ORAL_TABLET | Freq: Every day | ORAL | Status: DC
  Administered 2017-03-17 – 2017-03-20 (×4): 100 mg via ORAL
  Filled 2017-03-17 (×4): qty 1

## 2017-03-17 MED ORDER — LORAZEPAM 2 MG/ML IJ SOLN
1.0000 mg | Freq: Four times a day (QID) | INTRAMUSCULAR | Status: AC | PRN
  Administered 2017-03-17 – 2017-03-19 (×3): 1 mg via INTRAVENOUS
  Filled 2017-03-17 (×2): qty 1

## 2017-03-17 MED ORDER — PNEUMOCOCCAL VAC POLYVALENT 25 MCG/0.5ML IJ INJ
0.5000 mL | INJECTION | INTRAMUSCULAR | Status: AC
  Administered 2017-03-18: 0.5 mL via INTRAMUSCULAR
  Filled 2017-03-17: qty 0.5

## 2017-03-17 MED ORDER — IOPAMIDOL (ISOVUE-300) INJECTION 61%
75.0000 mL | Freq: Once | INTRAVENOUS | Status: AC | PRN
  Administered 2017-03-17: 75 mL via INTRAVENOUS

## 2017-03-17 MED ORDER — ACETAMINOPHEN 325 MG PO TABS: 650.0000 mg | ORAL_TABLET | Freq: Four times a day (QID) | ORAL | Status: DC | PRN

## 2017-03-17 MED ORDER — LORAZEPAM 1 MG PO TABS: 1.0000 mg | ORAL_TABLET | Freq: Four times a day (QID) | ORAL | Status: AC | PRN

## 2017-03-17 MED ORDER — NICOTINE 21 MG/24HR TD PT24
21.0000 mg | MEDICATED_PATCH | Freq: Every day | TRANSDERMAL | Status: DC
  Administered 2017-03-17 – 2017-03-20 (×4): 21 mg via TRANSDERMAL
  Filled 2017-03-17 (×4): qty 1

## 2017-03-17 MED ORDER — FOLIC ACID 1 MG PO TABS
1.0000 mg | ORAL_TABLET | Freq: Every day | ORAL | Status: DC
  Administered 2017-03-17 – 2017-03-20 (×4): 1 mg via ORAL
  Filled 2017-03-17 (×4): qty 1

## 2017-03-17 MED ORDER — SODIUM CHLORIDE 0.9 % IV SOLN
INTRAVENOUS | Status: DC
  Administered 2017-03-17: 15:00:00 via INTRAVENOUS

## 2017-03-17 MED ORDER — ACETAMINOPHEN 650 MG RE SUPP: 650.0000 mg | Freq: Four times a day (QID) | RECTAL | Status: DC | PRN

## 2017-03-17 MED ORDER — INFLUENZA VAC SPLIT HIGH-DOSE 0.5 ML IM SUSY
0.5000 mL | PREFILLED_SYRINGE | INTRAMUSCULAR | Status: AC
  Administered 2017-03-18: 0.5 mL via INTRAMUSCULAR
  Filled 2017-03-17: qty 0.5

## 2017-03-17 MED ORDER — SODIUM CHLORIDE 3 % IV SOLN
INTRAVENOUS | Status: DC
  Administered 2017-03-17: 30 mL/h via INTRAVENOUS
  Filled 2017-03-17 (×2): qty 500

## 2017-03-17 MED ORDER — SODIUM CHLORIDE 0.9 % IV SOLN
INTRAVENOUS | Status: DC
  Administered 2017-03-17: 07:00:00 via INTRAVENOUS

## 2017-03-17 MED ORDER — LORAZEPAM 2 MG/ML IJ SOLN
0.0000 mg | Freq: Two times a day (BID) | INTRAMUSCULAR | Status: DC
  Administered 2017-03-19: 2 mg via INTRAVENOUS
  Filled 2017-03-17: qty 1

## 2017-03-17 MED ORDER — THIAMINE HCL 100 MG/ML IJ SOLN
100.0000 mg | Freq: Every day | INTRAMUSCULAR | Status: DC
  Filled 2017-03-17: qty 2

## 2017-03-17 NOTE — Consult Note (Signed)
Reason for Consult: Hyponatremia Referring Physician: [name] is an 77 y.o. male.  HPI: He is a patient who has history of gout, alcohol abuse, presently was brought by his family members because of weakness, history of fall.  Patient was found at home covered with feces and urine.  Patient states that he falls once in a while but he does not know the reason.  He told me if his sister who brought him here and he does not know why he is brought here.  He drinks beer and the last time he drink was a day before yesterday about 3-5 cans of beer.  He did not drink anything yesterday because he did not have any many.  At this moment he denies any nausea or vomiting.  Denies any difficulty breathing.  Past Medical History:  Diagnosis Date  . Gout     History reviewed. No pertinent surgical history.  No family history on file.  Social History:  reports that he has been smoking cigarettes.  he has never used smokeless tobacco. He reports that he drinks alcohol. He reports that he does not use drugs.  Allergies: No Known Allergies  Medications: I have reviewed the patient's current medications.  Results for orders placed or performed during the hospital encounter of 03/16/17 (from the past 48 hour(s))  Comprehensive metabolic panel     Status: Abnormal   Collection Time: 03/16/17 10:36 PM  Result Value Ref Range   Sodium 110 (LL) 135 - 145 mmol/L   Potassium 4.9 3.5 - 5.1 mmol/L   Chloride 74 (L) 101 - 111 mmol/L   CO2 23 22 - 32 mmol/L   Glucose, Bld 63 (L) 65 - 99 mg/dL   BUN 14 6 - 20 mg/dL   Creatinine, Ser 1.18 0.61 - 1.24 mg/dL   Calcium 8.9 8.9 - 10.3 mg/dL   Total Protein 7.3 6.5 - 8.1 g/dL   Albumin 3.4 (L) 3.5 - 5.0 g/dL   AST 93 (H) 15 - 41 U/L   ALT 19 17 - 63 U/L   Alkaline Phosphatase 73 38 - 126 U/L   Total Bilirubin 1.5 (H) 0.3 - 1.2 mg/dL   GFR calc non Af Amer 59 (L) >60 mL/min   GFR calc Af Amer >60 >60 mL/min    Comment: (NOTE) The eGFR has been  calculated using the CKD EPI equation. This calculation has not been validated in all clinical situations. eGFR's persistently <60 mL/min signify possible Chronic Kidney Disease.    Anion gap 13 5 - 15    Comment: Performed at [facility], [address] [place], [place] [address]  CBC with Differential     Status: Abnormal   Collection Time: 03/16/17 10:36 PM  Result Value Ref Range   WBC 6.4 4.0 - 10.5 K/uL   RBC 3.89 (L) 4.22 - 5.81 MIL/uL   Hemoglobin 11.3 (L) 13.0 - 17.0 g/dL   HCT 32.0 (L) 39.0 - 52.0 %   MCV 82.3 78.0 - 100.0 fL   MCH 29.0 26.0 - 34.0 pg   MCHC 35.3 30.0 - 36.0 g/dL   RDW 14.1 11.5 - 15.5 %   Platelets 161 150 - 400 K/uL   Neutrophils Relative % 77 %   Neutro Abs 4.9 1.7 - 7.7 K/uL   Lymphocytes Relative 14 %   Lymphs Abs 0.9 0.7 - 4.0 K/uL   Monocytes Relative 9 %   Monocytes Absolute 0.6 0.1 - 1.0 K/uL   Eosinophils  Relative 0 %   Eosinophils Absolute 0.0 0.0 - 0.7 K/uL   Basophils Relative 0 %   Basophils Absolute 0.0 0.0 - 0.1 K/uL    Comment: Performed at [facility], [address], [place], [place] [address]  Troponin I     Status: Abnormal   Collection Time: 03/16/17 10:36 PM  Result Value Ref Range   Troponin I 0.11 (HH) <0.03 ng/mL    Comment: CRITICAL RESULT CALLED TO, READ BACK BY AND VERIFIED WITH: VOGLER,T. AT 2332 ON 03/16/17 BY EVA Performed at [facility], [address], [place], [place] [address]   Ethanol     Status: None   Collection Time: 03/16/17 10:37 PM  Result Value Ref Range   Alcohol, Ethyl (B) <10 <10 mg/dL    Comment:        LOWEST DETECTABLE LIMIT FOR SERUM ALCOHOL IS 10 mg/dL FOR MEDICAL PURPOSES ONLY Performed at [facility], [address] [place], [place] [address]   TSH     Status: None   Collection Time: 03/16/17 10:37 PM  Result Value Ref Range   TSH 1.411 0.350 - 4.500 uIU/mL    Comment: Performed by a 3rd Generation assay with a functional sensitivity of <=0.01 uIU/mL. Performed at [facility], [address] [place], [place] [address]   CBG monitoring, ED     Status: Abnormal   Collection Time: 03/16/17 11:36 PM  Result Value Ref Range   Glucose-Capillary 57 (L) 65 - 99 mg/dL  CBG monitoring, ED     Status: None   Collection Time: 03/17/17 12:05 AM  Result Value Ref Range   Glucose-Capillary 96 65 - 99 mg/dL  I-Stat CG4 Lactic Acid, ED     Status: None   Collection Time: 03/17/17 12:19 AM  Result Value Ref Range   Lactic Acid, Venous 1.84 0.5 - 1.9 mmol/L  Urinalysis, Routine w reflex microscopic     Status: Abnormal   Collection Time: 03/17/17 12:40 AM  Result Value Ref Range   Color, Urine YELLOW YELLOW   APPearance HAZY (A) CLEAR   Specific Gravity, Urine 1.006 1.005 - 1.030   pH 6.0 5.0 - 8.0   Glucose, UA NEGATIVE NEGATIVE mg/dL   Hgb urine dipstick MODERATE (A) NEGATIVE   Bilirubin Urine NEGATIVE NEGATIVE   Ketones, ur 5 (A) NEGATIVE mg/dL   Protein, ur NEGATIVE NEGATIVE mg/dL   Nitrite POSITIVE (A) NEGATIVE   Leukocytes, UA MODERATE (A) NEGATIVE   RBC / HPF 0-5 0 - 5 RBC/hpf   WBC, UA 6-30 0 - 5 WBC/hpf   Bacteria, UA MANY (A) NONE SEEN   Squamous Epithelial / LPF 0-5 (A) NONE SEEN    Comment: Performed at [facility], [address], [place], [place] [address]  Troponin I     Status: Abnormal   Collection Time: 03/17/17  2:34 AM  Result Value Ref Range   Troponin I 0.08 (HH) <0.03 ng/mL    Comment: CRITICAL RESULT CALLED TO, READ BACK BY AND VERIFIED WITH: MYRICK,B @ 0326 ON 03/17/17 BY JUW Performed at [facility], [address], [place], [place] [address]   TSH     Status: None   Collection Time: 03/17/17  2:34 AM  Result Value Ref Range   TSH 1.182 0.350 - 4.500 uIU/mL    Comment: Performed by a 3rd Generation assay with a functional sensitivity of <=0.01 uIU/mL. Performed at [facility], [address] [place], [place] [address]   Basic metabolic panel  Status: Abnormal   Collection Time: 03/17/17  2:34 AM  Result Value Ref Range    Sodium 112 (LL) 135 - 145 mmol/L   Potassium 4.6 3.5 - 5.1 mmol/L   Chloride 76 (L) 101 - 111 mmol/L   CO2 22 22 - 32 mmol/L   Glucose, Bld 65 65 - 99 mg/dL   BUN 13 6 - 20 mg/dL   Creatinine, Ser 1.16 0.61 - 1.24 mg/dL   Calcium 9.4 8.9 - 10.3 mg/dL   GFR calc non Af Amer >60 >60 mL/min   GFR calc Af Amer >60 >60 mL/min    Comment: (NOTE) The eGFR has been calculated using the CKD EPI equation. This calculation has not been validated in all clinical situations. eGFR's persistently <60 mL/min signify possible Chronic Kidney Disease.    Anion gap 14 5 - 15    Comment: Performed at [facility], [address] [place], [place] [address]  Troponin I     Status: Abnormal   Collection Time: 03/17/17  6:25 AM  Result Value Ref Range   Troponin I 0.06 (HH) <0.03 ng/mL    Comment: CRITICAL VALUE NOTED.  VALUE IS CONSISTENT WITH PREVIOUSLY REPORTED AND CALLED VALUE. Performed at [facility], [address], [place], [place] [address]   Basic metabolic panel     Status: Abnormal   Collection Time: 03/17/17  6:25 AM  Result Value Ref Range   Sodium 111 (LL) 135 - 145 mmol/L    Comment: CRITICAL RESULT CALLED TO, READ BACK BY AND VERIFIED WITH: CREWS,M AT 7:05AM ON 03/17/17 BY FESTERMAN,C    Potassium 4.0 3.5 - 5.1 mmol/L   Chloride 75 (L) 101 - 111 mmol/L   CO2 21 (L) 22 - 32 mmol/L   Glucose, Bld 67 65 - 99 mg/dL   BUN 13 6 - 20 mg/dL   Creatinine, Ser 1.07 0.61 - 1.24 mg/dL   Calcium 9.0 8.9 - 10.3 mg/dL   GFR calc non Af Amer >60 >60 mL/min   GFR calc Af Amer >60 >60 mL/min    Comment: (NOTE) The eGFR has been calculated using the CKD EPI equation. This calculation has not been validated in all clinical situations. eGFR's persistently <60 mL/min signify possible Chronic Kidney Disease.    Anion gap 15 5 - 15    Comment: Performed at [facility], [address], [place], [place] [address]    Dg Chest 2 View  Result Date: 03/16/2017 CLINICAL DATA:  Weakness EXAM: CHEST  2  VIEW COMPARISON:  03/16/2015 FINDINGS: Hyperinflation with emphysematous changes. Mild ground-glass opacity at the left lung base. No pleural effusion. Normal heart size. Aortic atherosclerosis. No pneumothorax. IMPRESSION: 1. Hyperinflation with emphysematous disease 2. Mild left basilar opacity, possible infiltrate. Electronically Signed   By: Donavan Foil M.D.   On: 03/16/2017 23:35   Ct Head Wo Contrast  Result Date: 03/17/2017 CLINICAL DATA:  [place] at home EXAM: CT HEAD WITHOUT CONTRAST TECHNIQUE: Contiguous axial images were obtained from the base of the skull through the vertex without intravenous contrast. COMPARISON:  03/16/2015 CT brain FINDINGS: Brain: No acute territorial infarction or intracranial mass is visualized. Slight asymmetric enlargement CSF density anterior to the left temporal lobe could reflect a small arachnoid cysts, no change. Enlarged extra-axial CSF densities anteriorly. Slightly more dense subdural collections along the frontal convexities, measuring up to 4 mm thick on the right and 3 mm on the left, similar appearance compared to the previous CT. No significant mass effect. No  hyperdensity in the subdural collections to suggest acute bleeding. Moderate to marked atrophy. Moderate small vessel ischemic changes of the white matter. Stable ventricle size. Vascular: No hyperdense vessels. Scattered calcifications at the carotid siphons. Skull: No depressed skull fracture. Sinuses/Orbits: Completely opacified right frontal sinus. Mucosal thickening in the right ethmoid sinus. Expanded right maxillary sinus containing gas and complex secretions and possible calcifications. Other: None IMPRESSION: 1. Bilateral subdural fluid collections, right greater than left, with slight isodense appearance of the right collection but similar compared to the previous head CT from 2017 suggesting chronic subdural collections. No definite acute hemorrhage is seen 2. Atrophy and small vessel ischemic  changes of the white matter 3. Expanded right maxillary sinus with complex secretions and possible calcifications. Suggest nonemergent sinus CT to more thoroughly evaluate. Electronically Signed   By: Donavan Foil M.D.   On: 03/17/2017 01:40   Ct Maxillofacial W Contrast  Result Date: 03/17/2017 CLINICAL DATA:  Multiple falls, follow-up sinus disease. EXAM: CT MAXILLOFACIAL WITH CONTRAST TECHNIQUE: Multidetector CT imaging of the maxillofacial structures was performed with intravenous contrast. Multiplanar CT image reconstructions were also generated. CONTRAST:  64m ISOVUE-300 IOPAMIDOL (ISOVUE-300) INJECTION 61% COMPARISON:  CT HEAD March 17, 2017 FINDINGS: OSSEOUS: The mandible is intact, the condyles are located. No acute facial fracture. Old bilateral nasal bone fractures. No destructive bony lesions. Poor dentition with multiple dental caries and periapical abscess. Multilevel moderate to severe cervical spondylosis. Severe bilateral C3-4, C4-5 and LEFT C5-6 neural foraminal narrowing. 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 ORBITS: Ocular globes intact. 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 Preservation the orbital fat. Normal appearance optic nerve sheath complexes and extra-ocular muscles. SINUSES: Expanded RIGHT maxillary sinus with heterogeneous contents, curvilinear densities without definite enhancement. Due to expansion, the RIGHT middle and inferior turbinates are not well characterized and, nasal recess effaced. 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 Mild RIGHT ethmoid mucosal thickening. Sphenoid sinuses and LEFT maxillary sinus well aerated. SOFT TISSUES: No significant soft tissue swelling. No subcutaneous gas or radiopaque foreign bodies. LIMITED INTRACRANIAL: Bilateral subdural hematomas as seen on today's CT HEAD. IMPRESSION: 1. Chronic RIGHT frontal and maxillary sinusitis. 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 Recommend nonemergent ENT consultation. 2. 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 Severe C3-4 through C5-6 neural foraminal narrowing. Electronically Signed   By: CElon AlasM.D.   On: 03/17/2017 03:41    Review of Systems  Constitutional: Positive for malaise/fatigue.  Respiratory: Negative for shortness of breath.   Cardiovascular: Negative for leg swelling.  Gastrointestinal: Negative for abdominal pain, nausea and vomiting.  Musculoskeletal: Positive for falls.  Neurological: Positive for weakness.   Blood pressure (!) 157/95, pulse 82, temperature 98 F (36.7 C), temperature source Oral, resp. rate 18, SpO2 100 %. Physical Exam  Constitutional: No distress.  Eyes: No scleral icterus.  Neck: No JVD present.  Cardiovascular: Normal rate and regular rhythm.  No murmur heard. Respiratory: No respiratory distress. He has no wheezes.  GI: He exhibits no distension. There is no tenderness.  Musculoskeletal: He exhibits no edema.    Assessment/Plan: 1] hyponatremia: Most likely hypovolemic hyponatremia/beer potomomenia as his BUN is slightly low.  At this moment part of the workup is pending.  At this moment patient seems to be stable.  No history of seizure 2] repeated fall: Most likely from a combination of hyponatremia and also alcohol abuse. 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 6] after that we will switch to normal saline.  Benita Boonstra S 03/17/2017, 9:15 AM

## 2017-03-17 NOTE — Progress Notes (Signed)
CRITICAL VALUE ALERT  Critical Value:  Serum Osmolality 241  Date & Time Notied:  03/17/2017, 1350  Provider Notified: Dr. Algis Liming  Orders Received/Actions taken: Will continue to monitor patient

## 2017-03-17 NOTE — Progress Notes (Signed)
Addendum   Sodium 117 after 3% saline since approximately 11:30 AM. Discussed in detail with Dr. Lowanda Foster.  Recommend stopping all IV fluids.  Follow BMP, if sodium <120, no further interventions, likely to correct by itself and reassess BMP in a.m.  If serum sodium > 120, start half normal saline or D5 W at 50 cc an hour and recheck BMP in a.m.  Vernell Leep, MD, FACP, [facility]. [facility] Pager [phone]  If 7PM-7AM, please contact night-coverage [email or web address] Password [email or web address] 03/17/2017, 5:14 PM

## 2017-03-17 NOTE — Progress Notes (Signed)
CRITICAL VALUE ALERT  Critical Value: Sodium 117  Date & Time Notied:  03/17/17 @ 1630.  Provider Notified: Lindaann Pascal, MD.  Orders Received/Actions taken: Continue to monitor patient.

## 2017-03-17 NOTE — Progress Notes (Addendum)
PROGRESS NOTE   Anthony Burton  HGD:[id number]    DOB: 05/24/1940    DOA: 03/16/2017  PCP: Rosita Fire, MD   I have briefly reviewed patients previous medical records in [facility].  Brief Narrative:  77 year old male with PMH of alcohol dependence, gout, presented to [facility] ED with complaints of worsening weakness and recurrent falls.  He was apparently found down at home and was covered in feces and urine.  He had associated decreased oral intake as well as weight loss.  The patient is a poor historian and no family at bedside.  In ED, troponin 0 0.11 and serum sodium 110.  Head CT showed chronic bilateral subdural fluid collections.  Admitted for evaluation and management of severe hyponatremia, weakness and frequent falls.  Nephrology consulted.   Assessment & Plan:   Principal Problem:   Hyponatremia Active Problems:   Gout   Alcohol abuse   Falls   Weakness   Elevated troponin   Bilateral subdural hematomas (HCC)   Maxillary sinusitis   1. Severe hyponatremia: Last sodium prior to this admission on 03/16/15: 128.  Now presented with sodium of 110.  Briefly placed on IV normal saline infusion but did not get much of this in the ED.  Sodium has remained in the 111-112 range.  Urine sodium <10.  Serum and urine osmolarity pending. Nephrology was consulted: Stopped normal saline and have started him on hypertonic saline via PICC line and have recommended serum sodium increase of no more than 1 mEq/h until sodium reaches 118-120 then switch over to normal saline.  Monitor BMP every 2 hours.  Discussed with Dr. Lowanda Foster, due to alcohol dependence, bilateral chronic subdural's at and severe hyponatremia, at risk for seizures hence treating with hypertonic saline.  This may be subacute on chronic.  Etiology likely multifactorial due to beer potomania, dehydration and cannot rule out SIADH.  Mental status as discussed below.  TSH normal.  Serum and urine osmolarity pending.  Uric acid  pending. 2. Alcohol dependence: No overt withdrawal at this time but remains at high risk for same. CIWA protocol. 3. Gout: No acute flare. 4. Frequent falls: Likely multifactorial related to alcohol dependence, hyponatremia.  PT evaluation. 5. Elevated troponin: No chest pain reported.  Downward trend.  EKG without acute changes.  Check CK to rule out mild rhabdomyolysis related to falls. 6. Chronic bilateral subdural hematomas: As per CT, similar compared to previous head CT from 2017 suggesting chronic subdural collections.  No definite acute hemorrhage seen.  Follow as outpatient with periodic CT head. 7. 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 8. Normocytic anemia: Follow CBCs periodically. 9. Hypoglycemia: Possibly due to poor oral intake in the context of alcohol dependence.  Monitor CBGs closely.  Encourage oral diet.   DVT prophylaxis: SCDs Code Status: Full Family Communication: None at bedside Disposition: To be determined pending clinical improvement and PT evaluation.   Consultants:  Nephrology  Procedures:  PICC line 03/17/17.  Antimicrobials:  None   Subjective: Seen this morning in the ED.  Pleasantly confused.  Does not know why he is in the hospital.  Patient alert and oriented to self and partly to place.  Reports that he drinks 2, 12 ounce beers daily and the last time was day prior to admission (do not know if he is under reporting).  Currently denies any complaints including headache, pain elsewhere, dizziness, lightheadedness, cough, chest pain or dyspnea.  As per ED RN, sustained a fall from his bed in the ED without any injuries.  He was able to feed by himself.  ROS: As above.  Objective:  Vitals:   03/17/17 1100 03/17/17 1130 03/17/17 1159 03/17/17 1230  BP: 109/77 (!) 129/91 (!) 129/91 121/67  Pulse:   97   Resp:   16   Temp:      TempSrc:       SpO2:   100%     Examination:  General exam: Elderly male, moderately built and frail, lying comfortably propped up in bed.  Unkempt. Respiratory system: Clear to auscultation. Respiratory effort normal. Cardiovascular system: S1 & S2 heard, RRR. No JVD, murmurs, rubs, gallops or clicks. No pedal edema. Gastrointestinal system: Abdomen is nondistended, soft and nontender. No organomegaly or masses felt. Normal bowel sounds heard. Central nervous system: Alert and oriented to person and partially to place. No focal neurological deficits. Extremities: Symmetric 5 x 5 power. Skin: No rashes, lesions or ulcers.  Chronic skin changes/patches of hypopigmentation noted on anterior abdominal wall and some on his legs.  Unclear etiology. Psychiatry: Judgement and insight impaired. Mood & affect appropriate.     Data Reviewed: I have personally reviewed following labs and imaging studies  CBC: Recent Labs  Lab 03/16/17 2236  WBC 6.4  NEUTROABS 4.9  HGB 11.3*  HCT 32.0*  MCV 82.3  PLT 462   Basic Metabolic Panel: Recent Labs  Lab 03/16/17 2236 03/17/17 0234 03/17/17 0625 03/17/17 0946 03/17/17 1141  NA 110* 112* 111* 112* 112*  K 4.9 4.6 4.0 3.7 3.8  CL 74* 76* 75* 77* 77*  CO2 23 22 21* 22 23  GLUCOSE 63* 65 67 94 82  BUN 14 13 13 12 12   CREATININE 1.18 1.16 1.07 1.15 1.12  CALCIUM 8.9 9.4 9.0 8.5* 8.3*  PHOS  --   --   --  2.2* 2.2*   Liver Function Tests: Recent Labs  Lab 03/16/17 2236 03/17/17 0946 03/17/17 1141  AST 93*  --   --   ALT 19  --   --   ALKPHOS 73  --   --   BILITOT 1.5*  --   --   PROT 7.3  --   --   ALBUMIN 3.4* 3.1* 3.0*   Cardiac Enzymes: Recent Labs  Lab 03/16/17 2236 03/17/17 0234 03/17/17 0625  TROPONINI 0.11* 0.08* 0.06*   CBG: Recent Labs  Lab 03/16/17 2336 03/17/17 0005  GLUCAP 57* 96    No results found for this or any previous visit (from the past 240 hour(s)).       Radiology Studies: Dg Chest 2 View  Result  Date: 03/16/2017 CLINICAL DATA:  Weakness EXAM: CHEST  2 VIEW COMPARISON:  03/16/2015 FINDINGS: Hyperinflation with emphysematous changes. Mild ground-glass opacity at the left lung base. No pleural effusion. Normal heart size. Aortic atherosclerosis. No pneumothorax. IMPRESSION: 1. Hyperinflation with emphysematous disease 2. Mild left basilar opacity, possible infiltrate. Electronically Signed   By: Donavan Foil M.D.   On: 03/16/2017 23:35   Ct Head Wo Contrast  Result Date: 03/17/2017 CLINICAL DATA:  [place] at home EXAM: CT HEAD WITHOUT CONTRAST TECHNIQUE: Contiguous axial images were obtained from the base of the skull through the vertex without intravenous contrast. COMPARISON:  03/16/2015 CT brain FINDINGS: Brain: No acute territorial infarction or intracranial mass is visualized. Slight asymmetric enlargement CSF density anterior to the left temporal lobe could reflect a small arachnoid cysts, no change. Enlarged extra-axial  CSF densities anteriorly. Slightly more dense subdural collections along the frontal convexities, measuring up to 4 mm thick on the right and 3 mm on the left, similar appearance compared to the previous CT. No significant mass effect. No hyperdensity in the subdural collections to suggest acute bleeding. Moderate to marked atrophy. Moderate small vessel ischemic changes of the white matter. Stable ventricle size. Vascular: No hyperdense vessels. Scattered calcifications at the carotid siphons. Skull: No depressed skull fracture. Sinuses/Orbits: Completely opacified right frontal sinus. Mucosal thickening in the right ethmoid sinus. Expanded right maxillary sinus containing gas and complex secretions and possible calcifications. Other: None IMPRESSION: 1. Bilateral subdural fluid collections, right greater than left, with slight isodense appearance of the right collection but similar compared to the previous head CT from 2017 suggesting chronic subdural collections. No definite acute  hemorrhage is seen 2. Atrophy and small vessel ischemic changes of the white matter 3. Expanded right maxillary sinus with complex secretions and possible calcifications. Suggest nonemergent sinus CT to more thoroughly evaluate. Electronically Signed   By: Donavan Foil M.D.   On: 03/17/2017 01:40   Ct Maxillofacial W Contrast  Result Date: 03/17/2017 CLINICAL DATA:  Multiple falls, follow-up sinus disease. EXAM: CT MAXILLOFACIAL WITH CONTRAST TECHNIQUE: Multidetector CT imaging of the maxillofacial structures was performed with intravenous contrast. Multiplanar CT image reconstructions were also generated. CONTRAST:  103mL ISOVUE-300 IOPAMIDOL (ISOVUE-300) INJECTION 61% COMPARISON:  CT HEAD March 17, 2017 FINDINGS: OSSEOUS: The mandible is intact, the condyles are located. No acute facial fracture. Old bilateral nasal bone fractures. No destructive bony lesions. Poor dentition with multiple dental caries and periapical abscess. Multilevel moderate to severe cervical spondylosis. Severe bilateral C3-4, C4-5 and LEFT C5-6 neural foraminal narrowing. 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 ORBITS: Ocular globes intact. 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 Preservation the orbital fat. Normal appearance optic nerve sheath complexes and extra-ocular muscles. SINUSES: Expanded RIGHT maxillary sinus with heterogeneous contents, curvilinear densities without definite enhancement. Due to expansion, the RIGHT middle and inferior turbinates are not well characterized and, nasal recess effaced. 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 Mild RIGHT ethmoid mucosal thickening. Sphenoid sinuses and LEFT maxillary sinus well aerated. SOFT TISSUES: No significant soft tissue swelling. No subcutaneous gas or radiopaque foreign bodies. LIMITED INTRACRANIAL: Bilateral subdural hematomas as seen on today's CT HEAD. IMPRESSION: 1. Chronic RIGHT frontal and maxillary sinusitis. 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 Recommend nonemergent ENT consultation. 2. 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 Severe C3-4 through C5-6 neural foraminal narrowing. Electronically Signed   By: Elon Alas M.D.   On: 03/17/2017 03:41        Scheduled Meds: . folic acid  1 mg Oral Daily  . LORazepam  0-4 mg Intravenous Q6H   Followed by  . [START ON 03/19/2017] LORazepam  0-4 mg Intravenous Q12H  . multivitamin with minerals  1 tablet Oral Daily  . thiamine  100 mg Oral Daily   Or  . thiamine  100 mg Intravenous Daily   Continuous Infusions: . sodium chloride (hypertonic) 30 mL/hr (03/17/17 1232)     LOS: 0 days     Vernell Leep, MD, FACP, [facility]. [facility] Pager [phone]  If 7PM-7AM, please contact night-coverage [email or web address] Password [email or web address] 03/17/2017, 1:04 PM

## 2017-03-17 NOTE — ED Notes (Signed)
Date and time results received: 03/17/17 1057 (use smartphrase ".now" to insert current time)  Test: Na Critical Value: 112  Name of Provider Notified:  Orders Received? Or Actions Taken?: none

## 2017-03-17 NOTE — H&P (Addendum)
History and Physical    Anthony Burton OAC:[id number] DOB: [birth year] DOA: 03/16/2017  PCP: Rosita Fire, MD   Patient coming from: Home  Chief Complaint: Generalized weakness with falls  HPI: Anthony Burton is a 77 y.o. male with medical history significant for alcohol abuse who presents with worsening weakness and recurrent falls.  He was apparently found down at home and was covered in feces and urine.  He is also had some decreased oral intake as well as weight loss noted.  He is difficult to obtain any history from and there are no family members currently at the bedside, but reportedly he is at his baseline level of functioning.  Patient denies any chest pain, cough, fever, or chills.   ED Course: Patient is noted to have troponin of 0.11 and sodium of 110.  Chest x-ray demonstrates a mild left basilar opacity, but does not appear suggestive of pneumonia.  Head CT with noted chronic bilateral subdural fluid collections as well as brain atrophy and right maxillary sinus expansion with complex fluid collection.  Patient has not been started on any medications or IV fluid.  Vital signs are stable.  Review of Systems: Cannot be appropriately addressed due to patient's condition.  Past Medical History:  Diagnosis Date  . Gout     History reviewed. No pertinent surgical history.   reports that he has been smoking cigarettes.  he has never used smokeless tobacco. He reports that he drinks alcohol. He reports that he does not use drugs.  No Known Allergies  No family history on file.  Prior to Admission medications   Medication Sig Start Date End Date Taking? Authorizing Provider  aspirin 325 MG tablet Take 650 mg by mouth daily.    [provider]  Aspirin-Acetaminophen (GOODY BODY PAIN) 500-325 MG PACK Take 1 packet by mouth daily as needed (pain).    [provider]  chlordiazePOXIDE (LIBRIUM) 25 MG capsule 1 po every 8 hours for anxiety 03/16/15   Milton Ferguson,  MD    Physical Exam: Vitals:   03/16/17 2313 03/17/17 0000 03/17/17 0015 03/17/17 0045  BP:  (!) 165/79  118/77  Pulse:   (!) 122 (!) 108  Resp:  (!) 22 (!) 21 20  Temp: 99.1 F (37.3 C)   98 F (36.7 C)  TempSrc: Rectal   Oral  SpO2:   99% 99%    Constitutional: NAD, calm, comfortable; disheveled and foul smelling. Vitals:   03/16/17 2313 03/17/17 0000 03/17/17 0015 03/17/17 0045  BP:  (!) 165/79  118/77  Pulse:   (!) 122 (!) 108  Resp:  (!) 22 (!) 21 20  Temp: 99.1 F (37.3 C)   98 F (36.7 C)  TempSrc: Rectal   Oral  SpO2:   99% 99%   Eyes: lids and conjunctivae normal ENMT: Mucous membranes are dry; poor dentition. Neck: normal, supple Respiratory: clear to auscultation bilaterally. Normal respiratory effort. No accessory muscle use.  Cardiovascular: Regular rate and rhythm, no murmurs. No extremity edema. Abdomen: no tenderness, no distention. Bowel sounds positive.  Musculoskeletal:  No joint deformity upper and lower extremities.   Skin: no rashes, lesions, ulcers.   Labs on Admission: I have personally reviewed following labs and imaging studies  CBC: Recent Labs  Lab 03/16/17 2236  WBC 6.4  NEUTROABS 4.9  HGB 11.3*  HCT 32.0*  MCV 82.3  PLT 010   Basic Metabolic Panel: Recent Labs  Lab 03/16/17 2236  NA 110*  K  4.9  CL 74*  CO2 23  GLUCOSE 63*  BUN 14  CREATININE 1.18  CALCIUM 8.9   GFR: CrCl cannot be calculated (Unknown ideal weight.). Liver Function Tests: Recent Labs  Lab 03/16/17 2236  AST 93*  ALT 19  ALKPHOS 73  BILITOT 1.5*  PROT 7.3  ALBUMIN 3.4*   No results for input(s): LIPASE, AMYLASE in the last 168 hours. No results for input(s): AMMONIA in the last 168 hours. Coagulation Profile: No results for input(s): INR, PROTIME in the last 168 hours. Cardiac Enzymes: Recent Labs  Lab 03/16/17 2236  TROPONINI 0.11*   BNP (last 3 results) No results for input(s): PROBNP in the last 8760 hours. HbA1C: No results for  input(s): HGBA1C in the last 72 hours. CBG: Recent Labs  Lab 03/16/17 2336 03/17/17 0005  GLUCAP 57* 96   Lipid Profile: No results for input(s): CHOL, HDL, LDLCALC, TRIG, CHOLHDL, LDLDIRECT in the last 72 hours. Thyroid Function Tests: Recent Labs    03/16/17 2237  TSH 1.411   Anemia Panel: No results for input(s): VITAMINB12, FOLATE, FERRITIN, TIBC, IRON, RETICCTPCT in the last 72 hours. Urine analysis:    Component Value Date/Time   COLORURINE YELLOW 03/17/2017 0040   APPEARANCEUR HAZY (A) 03/17/2017 0040   LABSPEC 1.006 03/17/2017 0040   PHURINE 6.0 03/17/2017 0040   GLUCOSEU NEGATIVE 03/17/2017 0040   HGBUR MODERATE (A) 03/17/2017 0040   BILIRUBINUR NEGATIVE 03/17/2017 0040   KETONESUR 5 (A) 03/17/2017 0040   PROTEINUR NEGATIVE 03/17/2017 0040   NITRITE POSITIVE (A) 03/17/2017 0040   LEUKOCYTESUR MODERATE (A) 03/17/2017 0040    Radiological Exams on Admission: Dg Chest 2 View  Result Date: 03/16/2017 CLINICAL DATA:  Weakness EXAM: CHEST  2 VIEW COMPARISON:  03/16/2015 FINDINGS: Hyperinflation with emphysematous changes. Mild ground-glass opacity at the left lung base. No pleural effusion. Normal heart size. Aortic atherosclerosis. No pneumothorax. IMPRESSION: 1. Hyperinflation with emphysematous disease 2. Mild left basilar opacity, possible infiltrate. Electronically Signed   By: Donavan Foil M.D.   On: 03/16/2017 23:35   Ct Head Wo Contrast  Result Date: 03/17/2017 CLINICAL DATA:  [place] EXAM: CT HEAD WITHOUT CONTRAST TECHNIQUE: Contiguous axial images were obtained from the base of the skull through the vertex without intravenous contrast. COMPARISON:  03/16/2015 CT brain FINDINGS: Brain: No acute territorial infarction or intracranial mass is visualized. Slight asymmetric enlargement CSF density anterior to the left temporal lobe could reflect a small arachnoid cysts, no change. Enlarged extra-axial CSF densities anteriorly. Slightly more dense subdural  collections along the frontal convexities, measuring up to 4 mm thick on the right and 3 mm on the left, similar appearance compared to the previous CT. No significant mass effect. No hyperdensity in the subdural collections to suggest acute bleeding. Moderate to marked atrophy. Moderate small vessel ischemic changes of the white matter. Stable ventricle size. Vascular: No hyperdense vessels. Scattered calcifications at the carotid siphons. Skull: No depressed skull fracture. Sinuses/Orbits: Completely opacified right frontal sinus. Mucosal thickening in the right ethmoid sinus. Expanded right maxillary sinus containing gas and complex secretions and possible calcifications. Other: None IMPRESSION: 1. Bilateral subdural fluid collections, right greater than left, with slight isodense appearance of the right collection but similar compared to the previous head CT from 2017 suggesting chronic subdural collections. No definite acute hemorrhage is seen 2. Atrophy and small vessel ischemic changes of the white matter 3. Expanded right maxillary sinus with complex secretions and possible calcifications. Suggest nonemergent sinus CT to more  thoroughly evaluate. Electronically Signed   By: Donavan Foil M.D.   On: 03/17/2017 01:40    EKG: Independently reviewed. NSR with high level of artifact.  Assessment/Plan Principal Problem:   Hyponatremia Active Problems:   Gout   Alcohol abuse   Falls   Weakness   Elevated troponin   Bilateral subdural hematomas (HCC)   Maxillary sinusitis    1. Severe hyponatremia likely secondary to beer potomania.  Continue to monitor serum sodium every 4 hours with goal of no more than 10 mEq increase in serum sodium over a period of 24 hours.  Placed on IV normal saline (time limited) which has not been initiated as of yet.  Check TSH as well as urine sodium and osmolarity. 2. Generalized weakness with recurrent falls related to above.  Bedrest with fall precautions and PT  evaluation for rehab placement.  Avoid blood thinners due to frequent falls at this time. Orthostatics in AM. 3. Alcohol abuse history.  No alcohol detected in blood currently.  Placed on DT prophylaxis with CIWA protocol. No signs of DT noted at this time. Thiamine and Folic acid. 4. Elevated troponin.  Patient is currently asymptomatic and EKG demonstrates no acute findings.  This is likely related to his dehydrated and hyponatremic state.  We will continue to trend. 5. Chronic bilateral subdural hematomas.  These do appear to be chronic on CT head and patient has no confusion or altered mentation noted at this time.  Will need to be monitored likely with repeat head CT and is likely related to his recurrent falls.  Maintain on SCDs for DVT prophylaxis.  Hold home aspirin. 6. Right maxillary sinusitis on CT.  Currently appears asymptomatic, but will give nasal sprays as needed.  Radiology read states that this requires further evaluation for which I will obtain sinus CT.   DVT prophylaxis: SCDs Code Status: Full Family Communication: None Disposition Plan:Likely SNF/Rehab on DC Consults called:None Admission status: Inpatient, med-surg   Brylee Berk Darleen Crocker DO [facility] Pager [phone]  If 7PM-7AM, please contact night-coverage [email or web address] Password [email or web address]  03/17/2017, 2:14 AM

## 2017-03-17 NOTE — ED Notes (Signed)
Date and time results received: 03/17/17 12:44 PM  (use smartphrase ".now" to insert current time)  Test: NA+ Critical Value: 112  Name of Provider Notified: [place]  Orders Received? Or Actions Taken?: Orders Received - See Orders for details

## 2017-03-17 NOTE — ED Notes (Signed)
Notified by vascular access the nurse would be here at 20 for PICC insertion.

## 2017-03-17 NOTE — ED Notes (Signed)
Pt is alert and pleasantly confused at this time.  Knows he is in the hospital, but not the date nor time.  Is unsure of why he is here and is watching tv.

## 2017-03-17 NOTE — ED Notes (Addendum)
Date and time results received: 03/17/17 3:28 AM Test: troponin Critical Value: 0.08 Name of Provider Notified: [email or web address] Orders Received? Or Actions Taken?: md notified.

## 2017-03-17 NOTE — Progress Notes (Signed)
Received critical lab value. Pt's sodium 115. Dr. Lowanda Foster verbally informed and received verbal order to discontinue sodium chloride (hypertonic) 3% solution. Also received verbal order for Sodium Chloride 55 mL/hr. Will continue to monitor patient.

## 2017-03-17 NOTE — ED Notes (Signed)
Pt eating breakfast at this time.  Some difficulty noted.  Food had to be set up for patient and frequently drops food on the way to mouth.  Offered assistance and refused at this time.  Will monitor.

## 2017-03-17 NOTE — ED Notes (Signed)
Pt sleeping soundly at this time.  Vascular access at bedside for PICC insertion.

## 2017-03-17 NOTE — Progress Notes (Signed)
CRITICAL VALUE ALERT  Critical Value:  Na 117  Date & Time Notied:  03/17/2017, 1851  Provider Notified: Dr. Algis Liming  Orders Received/Actions taken: MD E-Paged. Will continue to monitor patient

## 2017-03-17 NOTE — ED Notes (Signed)
Pt ate about 25% of breakfast.  Refused to eat any more with prompting.

## 2017-03-17 NOTE — Progress Notes (Signed)
CRITICAL VALUE ALERT  Critical Value:  Na 115  Date & Time Notied:  03/17/2017, 1425  Provider Notified: Dr. Algis Liming.  Orders Received/Actions taken: Will continue to monitor patient. Will notifiy Dr. Lowanda Foster.

## 2017-03-17 NOTE — ED Notes (Signed)
Pt becoming acutely more agitated.  Insisting there are cigars on the ceiling which he wants to go get.  Attempting to get out of bed and remove monitor and IV.  Myself and Jan Fireman RN attempting to redirect pt without success.  Medicated with Ativan as ordered.

## 2017-03-17 NOTE — ED Notes (Signed)
Continue to await vascular access for PICC insertion.

## 2017-03-17 NOTE — Progress Notes (Signed)
PT Cancellation Note  Patient Details Name: Anthony Burton MRN: [id number] DOB: 12/20/1940   Cancelled Treatment:    Reason Eval/Treat Not Completed: Fatigue/lethargy limiting ability to participate   Rayetta Humphrey, PT CLT [phone] 03/17/2017, 3:05 PM

## 2017-03-18 ENCOUNTER — Inpatient Hospital Stay (HOSPITAL_COMMUNITY): Payer: Medicare Other

## 2017-03-18 DIAGNOSIS — Z515 Encounter for palliative care: Secondary | ICD-10-CM

## 2017-03-18 DIAGNOSIS — E871 Hypo-osmolality and hyponatremia: Principal | ICD-10-CM

## 2017-03-18 DIAGNOSIS — S065X9A Traumatic subdural hemorrhage with loss of consciousness of unspecified duration, initial encounter: Secondary | ICD-10-CM

## 2017-03-18 DIAGNOSIS — F1010 Alcohol abuse, uncomplicated: Secondary | ICD-10-CM

## 2017-03-18 LAB — GLUCOSE, CAPILLARY
GLUCOSE-CAPILLARY: 75 mg/dL (ref 65–99)
GLUCOSE-CAPILLARY: 64 mg/dL — AB (ref 65–99)
Glucose-Capillary: 72 mg/dL (ref 65–99)
Glucose-Capillary: 97 mg/dL (ref 65–99)
Glucose-Capillary: 123 mg/dL — ABNORMAL HIGH (ref 65–99)

## 2017-03-18 LAB — COMPREHENSIVE METABOLIC PANEL
ALT: 21 U/L (ref 17–63)
AST: 78 U/L — ABNORMAL HIGH (ref 15–41)
Albumin: 3.1 g/dL — ABNORMAL LOW (ref 3.5–5.0)
Alkaline Phosphatase: 60 U/L (ref 38–126)
Anion gap: 12 (ref 5–15)
BUN: 10 mg/dL (ref 6–20)
CO2: 24 mmol/L (ref 22–32)
Calcium: 8.8 mg/dL — ABNORMAL LOW (ref 8.9–10.3)
Chloride: 81 mmol/L — ABNORMAL LOW (ref 101–111)
Creatinine, Ser: 1 mg/dL (ref 0.61–1.24)
GFR calc Af Amer: >60 mL/min (ref >60)
GFR calc non Af Amer: >60 mL/min (ref >60)
Glucose, Bld: 82 mg/dL (ref 65–99)
Potassium: 4.1 mmol/L (ref 3.5–5.1)
Sodium: 117 mmol/L — CL (ref 135–145)
Total Bilirubin: 1 mg/dL (ref 0.3–1.2)
Total Protein: 7 g/dL (ref 6.5–8.1)

## 2017-03-18 LAB — BASIC METABOLIC PANEL
Anion gap: 8 (ref 5–15)
Anion gap: 12 (ref 5–15)
BUN: 11 mg/dL (ref 6–20)
BUN: 9 mg/dL (ref 6–20)
CALCIUM: 8.7 mg/dL — AB (ref 8.9–10.3)
CHLORIDE: 82 mmol/L — AB (ref 101–111)
CO2: 26 mmol/L (ref 22–32)
CO2: 22 mmol/L (ref 22–32)
CREATININE: 1.18 mg/dL (ref 0.61–1.24)
Calcium: 8.5 mg/dL — ABNORMAL LOW (ref 8.9–10.3)
Chloride: 84 mmol/L — ABNORMAL LOW (ref 101–111)
Creatinine, Ser: 0.93 mg/dL (ref 0.61–1.24)
GFR calc Af Amer: >60 mL/min (ref >60)
GFR calc Af Amer: >60 mL/min (ref >60)
GFR calc non Af Amer: 59 mL/min — ABNORMAL LOW (ref >60)
GFR calc non Af Amer: >60 mL/min (ref >60)
GLUCOSE: 84 mg/dL (ref 65–99)
Glucose, Bld: 110 mg/dL — ABNORMAL HIGH (ref 65–99)
Potassium: 3.9 mmol/L (ref 3.5–5.1)
Potassium: 3.8 mmol/L (ref 3.5–5.1)
Sodium: 116 mmol/L — CL (ref 135–145)
Sodium: 118 mmol/L — CL (ref 135–145)

## 2017-03-18 MED ORDER — SODIUM CHLORIDE 0.9 % IV SOLN
INTRAVENOUS | Status: DC
  Administered 2017-03-18 – 2017-03-19 (×3): via INTRAVENOUS

## 2017-03-18 MED ORDER — DEXTROSE 5 % IV SOLN
INTRAVENOUS | Status: DC
  Administered 2017-03-18: 01:00:00 via INTRAVENOUS

## 2017-03-18 NOTE — Progress Notes (Signed)
PROGRESS NOTE    Patient: Anthony Burton     PCP: Rosita Fire, MD                    DOB: [birth year]            DOA: 03/16/2017 HCW:[id number]             DOS: 03/18/2017, 2:15 PM   Date of Service: the patient was seen and examined on 03/18/2017 Subjective:   Patient was seen and examined today.  Was able to answer a few questions appropriately, pleasantly confused laying in bed.  Diffuse cachexia noted.  Patient was being fed by family members. No issues overnight, mental status improving, no reported seizures overnight. Improved mentation, improved p.o. intake  Extensive family meeting at the bedside was held.  Patient's sister is willing to become her healthcare power of attorney for him. Family has expressed understanding the patient is not safe to return home.   ----------------------------------------------------------------------------------------------------------------------  Brief Narrative:   [name] is a 77 year old African-American male with extensive history of alcohol dependence/abuse, gout presented with cachexia, altered mental status, possible seizures.  Patient was found down at home covered in feces and urine.  Patient was noted for cachexia, progressive weight loss, and poor p.o. intake.  Poor hygiene.  Poor historian. Apparently has been living independently at home.  Was admitted through ED with mildly elevated troponin, sodium 110, CT of the head reporting chronic bilateral subdural fluid collection, Subsequently was admitted started on seizure medication neurology was consulted.    Principal Problem:   Hyponatremia Active Problems:   Gout   Alcohol abuse   Falls   Weakness   Elevated troponin   Bilateral subdural hematomas (HCC)   Maxillary sinusitis  Assessment & Plan:   1. Severe hyponatremia: -Improving, sodium level 117 today. Last sodium prior to this admission on 03/16/15: 128.   Presented with sodium of 110.  Briefly Sodium has  remained in the 111-112 range.  Urine sodium <10.  Serum and urine osmolarity pending. Nephrology was consulted: Stopped normal saline and have started him on hypertonic saline via PICC line and have recommended serum sodium increase of no more than 1 mEq/h until sodium reaches 118-120 then switch over to normal saline.  Monitor BMP every 2 hours.  Discussed with Dr. Lowanda Foster, due to alcohol dependence, bilateral chronic subdural's at and severe hyponatremia, at risk for seizures hence was treated with hypertonic saline.    Etiology likely multifactorial due to beer potomania, dehydration.    SIADH workup, serum osmolarity 241, urine sodium less than 10 and urine osmolality 140, hypovolemic, hyponatremia improving sodium level  Alcohol dependence:  No overt withdrawal at this time but remains at high risk for same. CIWA protocol.  Gout: No acute flare.  Frequent falls: Likely multifactorial related to alcohol dependence, hyponatremia.  PT evaluation.  Elevated troponin: No chest pain reported.  Downward trend.  EKG without acute changes.  Check CK to rule out mild rhabdomyolysis related to falls.  Chronic bilateral subdural hematomas: As per CT, similar compared to previous head CT from 2017 suggesting chronic subdural collections.  No definite acute hemorrhage seen.  Follow as outpatient with periodic CT head.  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  Normocytic anemia: Follow CBCs periodically.   Hypoglycemia: Possibly due to poor oral intake in the context of alcohol dependence.  Monitor CBGs closely.  Encourage oral diet.  DVT prophylaxis: SCDs Code Status: Full Family Communication:  Extensive family meeting was held today at the bedside, patient's current condition medical problems prognosis was discussed in detail the patient and family is agreeable for the older sister  to be active healthcare power of attorney. The family has expressed understanding and awareness that he might not be safe returning home.  PT/OT evaluation pending, social worker consulted for placement. Sister will discuss with palliative care regarding CODE STATUS, and become healthcare power of attorney.  Disposition: To be determined pending clinical improvement and PT evaluation.   Consultants:  Nephrology  Procedures:  PICC line 03/17/17.  Antimicrobials:  None  Procedures:  No admission procedures for hospital encounter.   Antimicrobials:  Anti-infectives (From admission, onward)   None       Objective: Vitals:   03/18/17 0630 03/18/17 1000 03/18/17 1001 03/18/17 1004  BP: 115/68 108/70 104/71 123/63  Pulse: 85 88 96 93  Resp: 17 18 18 18   Temp:  97.7 F (36.5 C) 97.7 F (36.5 C) 97.7 F (36.5 C)  TempSrc:  Oral Oral Oral  SpO2: 99% 100% 100% 100%  Weight:      Height:        Intake/Output Summary (Last 24 hours) at 03/18/2017 1415 Last data filed at 03/18/2017 0800 Gross per 24 hour  Intake 912.5 ml  Output 150 ml  Net 762.5 ml   Filed Weights   03/17/17 1351 03/18/17 0500  Weight: 57 kg (125 lb 10.6 oz) 56 kg (123 lb 7.3 oz)    Examination:  General exam: Appears calm and comfortable , generalized weaknesses, cachexia noted  alert and oriented x2 , pleasantly confused, espiratory system: Clear to auscultation. Respiratory effort normal. Cardiovascular system: S1 & S2 heard, RRR. No JVD, murmurs, rubs, gallops or clicks. No pedal edema. Gastrointestinal system: Abdomen is nondistended, soft and nontender. No organomegaly or masses felt. Normal bowel sounds heard. Central nervous system: Alert and oriented. No focal neurological deficits. Extremities: Symmetric 5 x 5 power.  Generalized weakness is noted able to move all 4 extremities, diffuse muscle wasting  Skin: No rashes, lesions or ulcers Psychiatry: Judgement and insight appear normal. Mood  & affect appropriate.     Data Reviewed: I have personally reviewed following labs and imaging studies  CBC: Recent Labs  Lab 03/16/17 2236  WBC 6.4  NEUTROABS 4.9  HGB 11.3*  HCT 32.0*  MCV 82.3  PLT 676   Basic Metabolic Panel: Recent Labs  Lab 03/17/17 0946 03/17/17 1141 03/17/17 1334 03/17/17 1536 03/17/17 1711 03/17/17 2311 03/18/17 0456  NA 112* 112* 115* 117* 117* 116* 117*  K 3.7 3.8 4.6 4.1 3.8 3.9 4.1  CL 77* 77* 80* 81* 82* 82* 81*  CO2 22 23 23 23 23 26 24   GLUCOSE 94 82 73 71 73 84 82  BUN 12 12 12 11 11 11 10   CREATININE 1.15 1.12 1.17 1.06 1.07 1.18 1.00  CALCIUM 8.5* 8.3* 9.0 8.7* 8.6* 8.7* 8.8*  PHOS 2.2* 2.2* 2.7 2.7  --   --   --    GFR: Estimated Creatinine Clearance: 51.3 mL/min (by C-G formula based on SCr of 1 mg/dL). Liver Function Tests: Recent Labs  Lab 03/16/17 2236 03/17/17 0946 03/17/17 1141 03/17/17 1334 03/17/17 1536 03/18/17 0456  AST 93*  --   --   --   --  78*  ALT 19  --   --   --   --  21  ALKPHOS 73  --   --   --   --  60  BILITOT 1.5*  --   --   --   --  1.0  PROT 7.3  --   --   --   --  7.0  ALBUMIN 3.4* 3.1* 3.0* 3.4* 3.1* 3.1*   No results for input(s): LIPASE, AMYLASE in the last 168 hours. No results for input(s): AMMONIA in the last 168 hours. Coagulation Profile: No results for input(s): INR, PROTIME in the last 168 hours. Cardiac Enzymes: Recent Labs  Lab 03/16/17 2236 03/17/17 0234 03/17/17 0625 03/17/17 1334  CKTOTAL  --   --   --  1,193*  TROPONINI 0.11* 0.08* 0.06* 0.05*   BNP (last 3 results) No results for input(s): PROBNP in the last 8760 hours. HbA1C: No results for input(s): HGBA1C in the last 72 hours. CBG: Recent Labs  Lab 03/16/17 2336 03/17/17 0005 03/18/17 0731 03/18/17 1145  GLUCAP 57* 96 72 75   Lipid Profile: No results for input(s): CHOL, HDL, LDLCALC, TRIG, CHOLHDL, LDLDIRECT in the last 72 hours. Thyroid Function Tests: Recent Labs    03/17/17 0234  TSH 1.182    Anemia Panel: No results for input(s): VITAMINB12, FOLATE, FERRITIN, TIBC, IRON, RETICCTPCT in the last 72 hours. Sepsis Labs: Recent Labs  Lab 03/17/17 0019  LATICACIDVEN 1.84    No results found for this or any previous visit (from the past 240 hour(s)).     Radiology Studies: Dg Chest 2 View  Result Date: 03/16/2017 CLINICAL DATA:  Weakness EXAM: CHEST  2 VIEW COMPARISON:  03/16/2015 FINDINGS: Hyperinflation with emphysematous changes. Mild ground-glass opacity at the left lung base. No pleural effusion. Normal heart size. Aortic atherosclerosis. No pneumothorax. IMPRESSION: 1. Hyperinflation with emphysematous disease 2. Mild left basilar opacity, possible infiltrate. Electronically Signed   By: Donavan Foil M.D.   On: 03/16/2017 23:35   Ct Head Wo Contrast  Result Date: 03/17/2017 CLINICAL DATA:  [place] at home EXAM: CT HEAD WITHOUT CONTRAST TECHNIQUE: Contiguous axial images were obtained from the base of the skull through the vertex without intravenous contrast. COMPARISON:  03/16/2015 CT brain FINDINGS: Brain: No acute territorial infarction or intracranial mass is visualized. Slight asymmetric enlargement CSF density anterior to the left temporal lobe could reflect a small arachnoid cysts, no change. Enlarged extra-axial CSF densities anteriorly. Slightly more dense subdural collections along the frontal convexities, measuring up to 4 mm thick on the right and 3 mm on the left, similar appearance compared to the previous CT. No significant mass effect. No hyperdensity in the subdural collections to suggest acute bleeding. Moderate to marked atrophy. Moderate small vessel ischemic changes of the white matter. Stable ventricle size. Vascular: No hyperdense vessels. Scattered calcifications at the carotid siphons. Skull: No depressed skull fracture. Sinuses/Orbits: Completely opacified right frontal sinus. Mucosal thickening in the right ethmoid sinus. Expanded right maxillary sinus  containing gas and complex secretions and possible calcifications. Other: None IMPRESSION: 1. Bilateral subdural fluid collections, right greater than left, with slight isodense appearance of the right collection but similar compared to the previous head CT from 2017 suggesting chronic subdural collections. No definite acute hemorrhage is seen 2. Atrophy and small vessel ischemic changes of the white matter 3. Expanded right maxillary sinus with complex secretions and possible calcifications. Suggest nonemergent sinus CT to more thoroughly evaluate. Electronically Signed   By: Donavan Foil M.D.   On: 03/17/2017 01:40   Ct Maxillofacial W Contrast  Result Date: 03/17/2017 CLINICAL DATA:  Multiple falls, follow-up sinus disease. EXAM: CT MAXILLOFACIAL WITH CONTRAST TECHNIQUE:  Multidetector CT imaging of the maxillofacial structures was performed with intravenous contrast. Multiplanar CT image reconstructions were also generated. CONTRAST:  2mL ISOVUE-300 IOPAMIDOL (ISOVUE-300) INJECTION 61% COMPARISON:  CT HEAD March 17, 2017 FINDINGS: OSSEOUS: The mandible is intact, the condyles are located. No acute facial fracture. Old bilateral nasal bone fractures. No destructive bony lesions. Poor dentition with multiple dental caries and periapical abscess. Multilevel moderate to severe cervical spondylosis. Severe bilateral C3-4, C4-5 and LEFT C5-6 neural foraminal narrowing. 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 ORBITS: Ocular globes intact. 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 Preservation the orbital fat. Normal appearance optic nerve sheath complexes and extra-ocular muscles. SINUSES: Expanded RIGHT maxillary sinus with heterogeneous contents, curvilinear densities without definite enhancement. Due to expansion, the RIGHT middle and inferior turbinates are not well characterized and, nasal recess effaced. 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 Mild RIGHT ethmoid  mucosal thickening. Sphenoid sinuses and LEFT maxillary sinus well aerated. SOFT TISSUES: No significant soft tissue swelling. No subcutaneous gas or radiopaque foreign bodies. LIMITED INTRACRANIAL: Bilateral subdural hematomas as seen on today's CT HEAD. IMPRESSION: 1. Chronic RIGHT frontal and maxillary sinusitis. 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 Recommend nonemergent ENT consultation. 2. 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 Severe C3-4 through C5-6 neural foraminal narrowing. Electronically Signed   By: Elon Alas M.D.   On: 03/17/2017 03:41    Scheduled Meds: . folic acid  1 mg Oral Daily  . LORazepam  0-4 mg Intravenous Q6H   Followed by  . [START ON 03/19/2017] LORazepam  0-4 mg Intravenous Q12H  . multivitamin with minerals  1 tablet Oral Daily  . nicotine  21 mg Transdermal Daily  . thiamine  100 mg Oral Daily   Or  . thiamine  100 mg Intravenous Daily   Continuous Infusions: . sodium chloride 55 mL/hr at 03/18/17 0957     LOS: 1 day    Time spent: >25 minutes   Deatra James, MD [facility] Pager [phone]  If 7PM-7AM, please contact night-coverage [email or web address] Password [email or web address] 03/18/2017, 2:15 PM

## 2017-03-18 NOTE — Progress Notes (Signed)
CRITICAL VALUE ALERT  Critical Value:  Sodium 118   Date & Time Notied:  03/18/17 @0030   Provider Notified:Dr. Schorr  Orders Received/Actions taken: Waiting for orders/call back .

## 2017-03-18 NOTE — Plan of Care (Signed)
  Acute Rehab PT Goals(only PT should resolve) Pt Will Go Supine/Side To Sit 03/18/2017 1213 - Progressing by Lonell Grandchild, PT Flowsheets Taken 03/18/2017 1213  Pt will go Supine/Side to Sit with min guard assist Patient Will Transfer Sit To/From Stand 03/18/2017 1213 - Progressing by Lonell Grandchild, PT Flowsheets Taken 03/18/2017 1213  Patient will transfer sit to/from stand with minimal assist Pt Will Transfer Bed To Chair/Chair To Bed 03/18/2017 1213 - Progressing by Lonell Grandchild, PT Flowsheets Taken 03/18/2017 1213  Pt will Transfer Bed to Chair/Chair to Bed with min assist Pt Will Ambulate 03/18/2017 1213 - Progressing by Lonell Grandchild, PT Flowsheets Taken 03/18/2017 1213  Pt will Ambulate with minimal assist;25 feet;with rolling walker  12:14 PM, 03/18/17 Lonell Grandchild, MPT Physical Therapist with [facility] [phone] office [phone] mobile phone

## 2017-03-18 NOTE — Progress Notes (Signed)
Dr Hilbert Bible paged and stated to stop D5% Solution @ 1ml/hr and get a STAT CMET. Lab called and made aware. IVF stopped. Will paged Dr. Hilbert Bible with results per her order.

## 2017-03-18 NOTE — Consult Note (Signed)
Consultation Note Date: 03/18/2017   Patient Name: Anthony Burton  DOB: 04/04/1940  MRN: [id number]  Age / Sex: 77 y.o., male  PCP: Rosita Fire, MD Referring Physician: Deatra James, MD  Reason for Consultation: Establishing goals of care  HPI/Patient Profile: 77 y.o. male  with past medical history of gout, alcoholism and recurrent falls who was admitted on 03/16/2017 with a sodium level of 110 and an elevated troponin.  Per report he has seized this admission due to hyponatremia.  He remains very confused.   Clinical Assessment and Goals of Care:  I have reviewed medical records including EPIC notes, labs and imaging, received report from Dr. Roger Shelter, and assessed the patient.  I will contact his family  to discuss diagnosis prognosis, GOC, EOL wishes, disposition and options tomorrow.  As far as functional and nutritional status he is eating very poorly.  His physical exam indicates very poor nutritional status.  He was able to stand with assistance and walk 25 feet.  Per RN he is requiring ativan for agitation.  Primary Decision Maker:  NEXT OF KIN    SUMMARY OF RECOMMENDATIONS    PMT left message for daughter to meet on 2/6.  Will attempt to complete a MOST form.  Code Status/Advance Care Planning:  Currently full code   Psycho-social/Spiritual:  Desire for further Chaplaincy support: [name] tells me he is a man of faith.   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    Discharge Planning: [place] for rehab with Palliative care service follow-up      Primary Diagnoses: Present on Admission: **None**   I have reviewed the medical record, interviewed the patient and family, and examined the patient. The following aspects are pertinent.  Past Medical History:  Diagnosis Date  . Gout    Social  History   Socioeconomic History  . Marital status: Married    Spouse name: None  . Number of children: None  . Years of education: None  . Highest education level: None  Social Needs  . Financial resource strain: None  . Food insecurity - worry: None  . Food insecurity - inability: None  . Transportation needs - medical: None  . Transportation needs - non-medical: None  Occupational History  . None  Tobacco Use  . Smoking status: Current Every Day Smoker    Types: Cigarettes  . Smokeless tobacco: Never Used  Substance and Sexual Activity  . Alcohol use: Yes    Comment: daily use  . Drug use: No  . Sexual activity: None  Other Topics Concern  . None  Social History Narrative  . None   History reviewed. No pertinent family history. Scheduled Meds: . folic acid  1 mg Oral Daily  . LORazepam  0-4 mg Intravenous Q6H   Followed by  . [START ON 03/19/2017] LORazepam  0-4 mg Intravenous Q12H  . multivitamin with minerals  1 tablet Oral Daily  . nicotine  21 mg Transdermal Daily  .  thiamine  100 mg Oral Daily   Or  . thiamine  100 mg Intravenous Daily   Continuous Infusions: . sodium chloride 55 mL/hr at 03/18/17 0957   PRN Meds:.acetaminophen **OR** acetaminophen, LORazepam **OR** LORazepam No Known Allergies Review of Systems patient confused  Physical Exam  Well developed cachectic confused male CV rrr resp no distress Abdomen thin, soft , nt, nd  Vital Signs: BP 118/63 (BP Location: Left Arm)   Pulse 87   Temp 97.8 F (36.6 C) (Oral)   Resp 18   Ht 5\' 7"  (1.702 m)   Wt 56 kg (123 lb 7.3 oz)   SpO2 100%   BMI 19.34 kg/m  Pain Assessment: No/denies pain   Pain Score: 0-No pain   SpO2: SpO2: 100 % O2 Device:SpO2: 100 % O2 Flow Rate: .   IO: Intake/output summary:   Intake/Output Summary (Last 24 hours) at 03/18/2017 1614 Last data filed at 03/18/2017 1200 Gross per 24 hour  Intake 945.83 ml  Output 150 ml  Net 795.83 ml    LBM: Last BM Date:  03/18/17 Baseline Weight: Weight: 57 kg (125 lb 10.6 oz) Most recent weight: Weight: 56 kg (123 lb 7.3 oz)     Palliative Assessment/Data: 30 - 40%     Time In: 3:00 Time Out: 3:30 Time Total: 30 min Greater than 50%  of this time was spent counseling and coordinating care related to the above assessment and plan.  Signed by: Florentina Jenny, PA-C Palliative Medicine Pager: [phone]  Please contact Palliative Medicine Team phone at [phone] for questions and concerns.  For individual provider: See Shea Evans

## 2017-03-18 NOTE — Progress Notes (Signed)
Subjective: Interval History: has no complaint of nausea or vomiting.  Patient overall is poor historian..  Objective: Vital signs in last 24 hours: Temp:  [97.7 F (36.5 C)-97.8 F (36.6 C)] 97.7 F (36.5 C) (02/05 0000) Pulse Rate:  [85-104] 85 (02/05 0630) Resp:  [15-18] 17 (02/05 0630) BP: (109-132)/(67-91) 115/68 (02/05 0630) SpO2:  [94 %-100 %] 99 % (02/05 0630) Weight:  [56 kg (123 lb 7.3 oz)-57 kg (125 lb 10.6 oz)] 56 kg (123 lb 7.3 oz) (02/05 0500) Weight change:   Intake/Output from previous day: 02/04 0701 - 02/05 0700 In: 1103.8 [P.O.:480; I.V.:623.8] Out: 150 [Urine:150] Intake/Output this shift: No intake/output data recorded.  General appearance: alert, cooperative and no distress Resp: clear to auscultation bilaterally Cardio: regular rate and rhythm Extremities: No edema  Lab Results: Recent Labs    03/16/17 2236  WBC 6.4  HGB 11.3*  HCT 32.0*  PLT 161   BMET:  Recent Labs    03/17/17 2311 03/18/17 0456  NA 116* 117*  K 3.9 4.1  CL 82* 81*  CO2 26 24  GLUCOSE 84 82  BUN 11 10  CREATININE 1.18 1.00  CALCIUM 8.7* 8.8*   No results for input(s): PTH in the last 72 hours. Iron Studies: No results for input(s): IRON, TIBC, TRANSFERRIN, FERRITIN in the last 72 hours.  Studies/Results: Dg Chest 2 View  Result Date: 03/16/2017 CLINICAL DATA:  Weakness EXAM: CHEST  2 VIEW COMPARISON:  03/16/2015 FINDINGS: Hyperinflation with emphysematous changes. Mild ground-glass opacity at the left lung base. No pleural effusion. Normal heart size. Aortic atherosclerosis. No pneumothorax. IMPRESSION: 1. Hyperinflation with emphysematous disease 2. Mild left basilar opacity, possible infiltrate. Electronically Signed   By: Donavan Foil M.D.   On: 03/16/2017 23:35   Ct Head Wo Contrast  Result Date: 03/17/2017 CLINICAL DATA:  [place] at home EXAM: CT HEAD WITHOUT CONTRAST TECHNIQUE: Contiguous axial images were obtained from the base of the skull through the vertex  without intravenous contrast. COMPARISON:  03/16/2015 CT brain FINDINGS: Brain: No acute territorial infarction or intracranial mass is visualized. Slight asymmetric enlargement CSF density anterior to the left temporal lobe could reflect a small arachnoid cysts, no change. Enlarged extra-axial CSF densities anteriorly. Slightly more dense subdural collections along the frontal convexities, measuring up to 4 mm thick on the right and 3 mm on the left, similar appearance compared to the previous CT. No significant mass effect. No hyperdensity in the subdural collections to suggest acute bleeding. Moderate to marked atrophy. Moderate small vessel ischemic changes of the white matter. Stable ventricle size. Vascular: No hyperdense vessels. Scattered calcifications at the carotid siphons. Skull: No depressed skull fracture. Sinuses/Orbits: Completely opacified right frontal sinus. Mucosal thickening in the right ethmoid sinus. Expanded right maxillary sinus containing gas and complex secretions and possible calcifications. Other: None IMPRESSION: 1. Bilateral subdural fluid collections, right greater than left, with slight isodense appearance of the right collection but similar compared to the previous head CT from 2017 suggesting chronic subdural collections. No definite acute hemorrhage is seen 2. Atrophy and small vessel ischemic changes of the white matter 3. Expanded right maxillary sinus with complex secretions and possible calcifications. Suggest nonemergent sinus CT to more thoroughly evaluate. Electronically Signed   By: Donavan Foil M.D.   On: 03/17/2017 01:40   Ct Maxillofacial W Contrast  Result Date: 03/17/2017 CLINICAL DATA:  Multiple falls, follow-up sinus disease. EXAM: CT MAXILLOFACIAL WITH CONTRAST TECHNIQUE: Multidetector CT imaging of the maxillofacial structures was performed with  intravenous contrast. Multiplanar CT image reconstructions were also generated. CONTRAST:  82mL ISOVUE-300 IOPAMIDOL  (ISOVUE-300) INJECTION 61% COMPARISON:  CT HEAD March 17, 2017 FINDINGS: OSSEOUS: The mandible is intact, the condyles are located. No acute facial fracture. Old bilateral nasal bone fractures. No destructive bony lesions. Poor dentition with multiple dental caries and periapical abscess. Multilevel moderate to severe cervical spondylosis. Severe bilateral C3-4, C4-5 and LEFT C5-6 neural foraminal narrowing. 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 ORBITS: Ocular globes intact. 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 Preservation the orbital fat. Normal appearance optic nerve sheath complexes and extra-ocular muscles. SINUSES: Expanded RIGHT maxillary sinus with heterogeneous contents, curvilinear densities without definite enhancement. Due to expansion, the RIGHT middle and inferior turbinates are not well characterized and, nasal recess effaced. 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 Mild RIGHT ethmoid mucosal thickening. Sphenoid sinuses and LEFT maxillary sinus well aerated. SOFT TISSUES: No significant soft tissue swelling. No subcutaneous gas or radiopaque foreign bodies. LIMITED INTRACRANIAL: Bilateral subdural hematomas as seen on today's CT HEAD. IMPRESSION: 1. Chronic RIGHT frontal and maxillary sinusitis. 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 Recommend nonemergent ENT consultation. 2. 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 Severe C3-4 through C5-6 neural foraminal narrowing. Electronically Signed   By: Elon Alas M.D.   On: 03/17/2017 03:41    I have reviewed the patient's current medications.  Assessment/Plan: 1] hyponatremia: His serum osmolality is 241 hence 2 hyponatremia.  His urine sodium is less than 10 and urine osmolality is 140.  Hence hypovolemic hyponatremia.  Presently his sodium has improved from 111-117 the last 24 hours.  Patient presently is a symptomatic. 2] history of  alcohol abuse  3] history of weakness and fall. 4] history of gout: His uric acid is 5.6 Plan: We will continue with free water restriction 2] normal saline at 55 cc/h 3] we will check his renal panel in the morning.   LOS: 1 day   Michaella Imai S 03/18/2017,8:41 AM

## 2017-03-18 NOTE — Evaluation (Signed)
Physical Therapy Evaluation Patient Details Name: Anthony Burton MRN: [id number] DOB: August 24, 1940 Today's Date: 03/18/2017   History of Present Illness   Anthony Burton is a 77 y.o. male with medical history significant for alcohol abuse who presents with worsening weakness and recurrent falls.  He was apparently found down at home and was covered in feces and urine.  He is also had some decreased oral intake as well as weight loss noted.  He is difficult to obtain any history from and there are no family members currently at the bedside, but reportedly he is at his baseline level of functioning.      Clinical Impression  Patient demonstrates slow labored movement with poor endurance/tolerance for standing due to BLE weakness, fatigue.  Patient will benefit from continued physical therapy in hospital and recommended venue below to increase strength, balance, endurance for safe ADLs and gait.    Follow Up Recommendations SNF    Equipment Recommendations       Recommendations for Other Services       Precautions / Restrictions Precautions Precautions: Fall Restrictions Weight Bearing Restrictions: No      Mobility  Bed Mobility Overal bed mobility: Needs Assistance Bed Mobility: Supine to Sit;Sit to Supine     Supine to sit: Mod assist Sit to supine: Mod assist      Transfers Overall transfer level: Needs assistance Equipment used: Rolling walker (2 wheeled) Transfers: Sit to/from Stand Sit to Stand: Mod assist Stand pivot transfers: Mod assist;Max assist          Ambulation/Gait Ambulation/Gait assistance: Max assist Ambulation Distance (Feet): 3 Feet Assistive device: Rolling walker (2 wheeled) Gait Pattern/deviations: Decreased step length - left;Decreased stance time - right;Decreased stride length   Gait velocity interpretation: Below normal speed for age/gender General Gait Details: limited to 3-4 unsteady steps due to poor standing balance,  fatigue  Stairs            Wheelchair Mobility    Modified Rankin (Stroke Patients Only)       Balance Overall balance assessment: Needs assistance Sitting-balance support: Feet supported;No upper extremity supported Sitting balance-Leahy Scale: Fair     Standing balance support: Bilateral upper extremity supported;During functional activity Standing balance-Leahy Scale: Poor                               Pertinent Vitals/Pain Pain Assessment: No/denies pain    Home Living Family/patient expects to be discharged to:: Private residence Living Arrangements: Alone   Type of Home: Apartment Home Access: Level entry     Home Layout: One level Home Equipment: Cane - single point;Walker - 2 wheels      Prior Function Level of Independence: Independent with assistive device(s)         Comments: ambulates with SPC and RW for household, short distanced community     Hand Dominance        Extremity/Trunk Assessment   Upper Extremity Assessment Upper Extremity Assessment: Generalized weakness    Lower Extremity Assessment Lower Extremity Assessment: Generalized weakness    Cervical / Trunk Assessment Cervical / Trunk Assessment: Normal  Communication   Communication: No difficulties  Cognition Arousal/Alertness: Awake/alert Behavior During Therapy: WFL for tasks assessed/performed Overall Cognitive Status: Within Functional Limits for tasks assessed  General Comments      Exercises     Assessment/Plan    PT Assessment Patient needs continued PT services  PT Problem List Decreased strength;Decreased activity tolerance;Decreased balance;Decreased mobility       PT Treatment Interventions Gait training;Functional mobility training;Therapeutic activities;Therapeutic exercise;Patient/family education    PT Goals (Current goals can be found in the Care Plan section)  Acute Rehab  PT Goals Patient Stated Goal: return home with assist PT Goal Formulation: With patient/family Time For Goal Achievement: 04/01/17 Potential to Achieve Goals: Good    Frequency Min 3X/week   Barriers to discharge        Co-evaluation               AM-PAC PT "6 Clicks" Daily Activity  Outcome Measure Difficulty turning over in bed (including adjusting bedclothes, sheets and blankets)?: A Little Difficulty moving from lying on back to sitting on the side of the bed? : A Lot Difficulty sitting down on and standing up from a chair with arms (e.g., wheelchair, bedside commode, etc,.)?: A Lot Help needed moving to and from a bed to chair (including a wheelchair)?: A Lot Help needed walking in hospital room?: A Lot Help needed climbing 3-5 steps with a railing? : Total 6 Click Score: 12    End of Session   Activity Tolerance: Patient limited by fatigue;Patient limited by lethargy Patient left: in bed;with call bell/phone within reach;with family/visitor present Nurse Communication: Mobility status PT Visit Diagnosis: Unsteadiness on feet (R26.81);Other abnormalities of gait and mobility (R26.89);Muscle weakness (generalized) (M62.81)    Time: 6147-0929 PT Time Calculation (min) (ACUTE ONLY): 14 min   Charges:   PT Evaluation $PT Eval Moderate Complexity: 1 Mod PT Treatments $Therapeutic Activity: 23-37 mins   PT G Codes:        12:12 PM, 03/25/2017 Lonell Grandchild, MPT Physical Therapist with [facility] [phone] office [phone] mobile phone

## 2017-03-18 NOTE — Progress Notes (Signed)
After speaking with Dr. Hilbert Bible about pts Sodium level of 118, new order for Dextrose 5% solution @ 69ml/hr. Will continue to monitor pt

## 2017-03-18 NOTE — Progress Notes (Signed)
Initial Nutrition Assessment  DOCUMENTATION CODES:   Non-severe (moderate) malnutrition in context of social or environmental circumstances  INTERVENTION:  ProStat 30 ml BID (each 30 ml provides 100 kcal, 15 gr protein)   Multivitamin daily  Snack q hs   NUTRITION DIAGNOSIS:   Moderate Malnutrition related to (ETOH abuse, increased weakness and falls) as evidenced by moderate fat depletion, moderate muscle depletion, severe muscle depletion.  GOAL:  Pt to meet >/= 90% of their estimated nutrition needs    MONITOR:   PO intake, Supplement acceptance  REASON FOR ASSESSMENT:   Malnutrition Screening Tool   ASSESSMENT:  77 yo male with hx of ETOH abuse. Presents to ED after being found on floor at home. Increased weakness and expect poor oral intake given his malnourished appearance and hx of alcoholism. Patient unable to provide examples of usual nutrition intake. His meal consumption (50-75%) today breakfast and lunch. Labs and meds reviewed. Sodium is low-117 today. Recent Labs  Lab 03/17/17 1141 03/17/17 1334 03/17/17 1536 03/17/17 1711 03/17/17 2311 03/18/17 0456  NA 112* 115* 117* 117* 116* 117*  K 3.8 4.6 4.1 3.8 3.9 4.1  CL 77* 80* 81* 82* 82* 81*  CO2 23 23 23 23 26 24   BUN 12 12 11 11 11 10   CREATININE 1.12 1.17 1.06 1.07 1.18 1.00  CALCIUM 8.3* 9.0 8.7* 8.6* 8.7* 8.8*  PHOS 2.2* 2.7 2.7  --   --   --   GLUCOSE 82 73 71 73 84 82   NUTRITION - FOCUSED PHYSICAL EXAM:    Most Recent Value  Orbital Region  Moderate depletion  Upper Arm Region  Moderate depletion  Thoracic and Lumbar Region  Moderate depletion  Buccal Region  Moderate depletion  Temple Region  Moderate depletion  Clavicle Bone Region  Moderate depletion  Clavicle and Acromion Bone Region  Moderate depletion  Scapular Bone Region  Unable to assess  Dorsal Hand  Moderate depletion  Patellar Region  Severe depletion  Anterior Thigh Region  Severe depletion  Posterior Calf Region  Severe  depletion  Edema (RD Assessment)  None  Hair  Unable to assess  Eyes  Unable to assess  Mouth  Reviewed  Skin  Reviewed  Nails  Reviewed     Diet Order:  Diet regular Room service appropriate? Yes; Fluid consistency: Thin  EDUCATION NEEDS:  None indicated at this time Not appropriate for education at this time Skin:  Skin Assessment: Reviewed RN Assessment  Last BM:  03/18/17  Height:   Ht Readings from Last 1 Encounters:  03/17/17 5\' 7"  (1.702 m)    Weight:   Wt Readings from Last 1 Encounters:  03/18/17 123 lb 7.3 oz (56 kg)    Ideal Body Weight:  67 kg  BMI:  Body mass index is 19.34 kg/m.  Estimated Nutritional Needs:   Kcal:  1960-2100  Protein:  80-90 gr  Fluid:  >1.7 liters daily  Colman Cater MS,RD,CSG,LDN Office: [phone] Pager: [phone]

## 2017-03-18 NOTE — Progress Notes (Signed)
CRITICAL VALUE ALERT  Critical Value:  Sodium 118   Date & Time Notied:  03/18/17 @ 2213  Provider Notified: Dr. Hilbert Bible  Orders Received/Actions taken: Waiting for orders/call back

## 2017-03-19 ENCOUNTER — Inpatient Hospital Stay (HOSPITAL_COMMUNITY): Payer: Medicare Other

## 2017-03-19 DIAGNOSIS — E440 Moderate protein-calorie malnutrition: Secondary | ICD-10-CM

## 2017-03-19 LAB — GLUCOSE, CAPILLARY
GLUCOSE-CAPILLARY: 101 mg/dL — AB (ref 65–99)
GLUCOSE-CAPILLARY: 91 mg/dL (ref 65–99)
GLUCOSE-CAPILLARY: 92 mg/dL (ref 65–99)
GLUCOSE-CAPILLARY: 93 mg/dL (ref 65–99)
GLUCOSE-CAPILLARY: 98 mg/dL (ref 65–99)
Glucose-Capillary: 105 mg/dL — ABNORMAL HIGH (ref 65–99)
Glucose-Capillary: 120 mg/dL — ABNORMAL HIGH (ref 65–99)

## 2017-03-19 LAB — RENAL FUNCTION PANEL
ALBUMIN: 2.7 g/dL — AB (ref 3.5–5.0)
Anion gap: 12 (ref 5–15)
BUN: 7 mg/dL (ref 6–20)
CHLORIDE: 88 mmol/L — AB (ref 101–111)
CO2: 22 mmol/L (ref 22–32)
Calcium: 8.3 mg/dL — ABNORMAL LOW (ref 8.9–10.3)
Creatinine, Ser: 0.87 mg/dL (ref 0.61–1.24)
GFR calc Af Amer: >60 mL/min (ref >60)
Glucose, Bld: 98 mg/dL (ref 65–99)
PHOSPHORUS: 2.2 mg/dL — AB (ref 2.5–4.6)
POTASSIUM: 3.6 mmol/L (ref 3.5–5.1)
Sodium: 122 mmol/L — ABNORMAL LOW (ref 135–145)

## 2017-03-19 MED ORDER — MEGESTROL ACETATE 400 MG/10ML PO SUSP
400.0000 mg | Freq: Every day | ORAL | Status: DC
  Administered 2017-03-19 – 2017-03-20 (×2): 400 mg via ORAL
  Filled 2017-03-19 (×2): qty 10

## 2017-03-19 NOTE — Progress Notes (Signed)
No charge note  Checked on patient.  Left second voice mail for daughter to schedule an appointment.  Asked RN to page me if daughter is at bedside.  Florentina Jenny, PA-C Palliative Medicine Pager: [phone]

## 2017-03-19 NOTE — Progress Notes (Addendum)
Daily Progress Note   Patient Name: Anthony Burton       Date: 03/19/2017 DOB: [birth year]  Age: 77 y.o. MRN#: [id number] Attending Physician: Anthony James, MD Primary Care Physician: Anthony Fire, MD Admit Date: 03/16/2017  Reason for Consultation/Follow-up: Establishing goals of care  Subjective: Met with sister (Anthony Burton), brother, niece, and cousin at bedside.  Discussed reversible issues (sodium level) and non reversible issues - brain changes, dementia.  Talked about lethargy and lack of PO intake.  Anthony Burton strongly asserts that he can never live alone again - he must live in a facility.  We discussed SNFs medicare and medicaid.  We discussed Hospice services at nursing homes.  Anthony Burton explains that Anthony Burton used to work and he was married, but never had kids. He was a very social person.  He began to drink very heavily 3 yrs ago after his wife died.  Since then he sits at home by the window staring out and drinking.   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  We discussed code status.  Anthony Burton lost her husband 4 years ago and reflects on having to make him a DNR.   She understands that her brother would probably not survive a code and would at a minimum be severely injured by a code blue.  The cousin gets up and walks out of the room "I can't listen to this".  The patient's brother shakes his head, but in the end he agrees with Anthony Burton.  We reviewed a MOST form in detail.  I encouraged them to complete it before discharge.  No decisions were made today as the family is divided.  PMT will revisit tomorrow.   Assessment:  48 yom with hyponatremia, lethargy and cachexia.  Poor PO intake.    Patient Profile/HPI:  77 y.o. male  with past medical  history of gout, alcoholism and recurrent falls who was admitted on 03/16/2017 with a sodium level of 110 and an elevated troponin.  Per report he has seized this admission due to hyponatremia.  He remains very confused.   Length of Stay: 2  Current Medications: Scheduled Meds:  . folic acid  1 mg Oral Daily  . megestrol  400 mg Oral Daily  . multivitamin with minerals  1 tablet Oral Daily  .  nicotine  21 mg Transdermal Daily  . thiamine  100 mg Oral Daily   Or  . thiamine  100 mg Intravenous Daily    Continuous Infusions: . sodium chloride 55 mL/hr at 03/19/17 0357    PRN Meds: acetaminophen **OR** acetaminophen, LORazepam **OR** LORazepam  Physical Exam        Thin frail lethargic male, slow to move, slow to respond.  Speech is very difficult to understand Cachectic.   No respiratory distress  Vital Signs: BP 127/68 (BP Location: Right Arm)   Pulse 95   Temp 98.2 F (36.8 C) (Oral)   Resp 14   Ht 5' 7"  (1.702 m)   Wt 56.3 kg (124 lb 1.9 oz)   SpO2 98%   BMI 19.44 kg/m  SpO2: SpO2: 98 % O2 Device: O2 Device: Not Delivered O2 Flow Rate:    Intake/output summary:   Intake/Output Summary (Last 24 hours) at 03/19/2017 1336 Last data filed at 03/19/2017 0636 Gross per 24 hour  Intake 1086.17 ml  Output 650 ml  Net 436.17 ml   LBM: Last BM Date: 03/18/17 Baseline Weight: Weight: 57 kg (125 lb 10.6 oz) Most recent weight: Weight: 56.3 kg (124 lb 1.9 oz)       Palliative Assessment/Data: 20%    Flowsheet Rows     Most Recent Value  Intake Tab  Referral Department  Hospitalist  Unit at Time of Referral  Med/Surg Unit  Palliative Care Primary Diagnosis  -- [ Wernicke's dementia, severe hyponatremia]  Date Notified  03/18/17  Palliative Care Type  New Palliative care  Reason for referral  Clarify Goals of Care, Advance Care Planning  Date of Admission  03/16/17  Date first seen by Palliative Care  03/18/17  # of days Palliative referral response time  0 Day(s)    # of days IP prior to Palliative referral  2  Clinical Assessment  Psychosocial & Spiritual Assessment  Palliative Care Outcomes      Patient Active Problem List   Diagnosis Date Noted  . Malnutrition of moderate degree 03/19/2017  . Palliative care encounter   . Gout 03/17/2017  . Alcohol abuse 03/17/2017  . Falls 03/17/2017  . Weakness 03/17/2017  . Hyponatremia 03/17/2017  . Elevated troponin 03/17/2017  . Bilateral subdural hematomas ([place]) 03/17/2017  . Maxillary sinusitis 03/17/2017    Palliative Care Plan    Recommendations/Plan:  PMT will continue to meet with Anthony Burton family to encourage DNR and complete the MOST form.    If Anthony Burton does not improve his nutritional intake he will soon be eligible for Hospice House  Discussed with family - no PEG tube.  It is contra-indicated with advanced dementia.   Prognosis:   < 6 months secondary to on-going lethargy, ETOH dementia, multiple falls with bilateral SDHs and cachexia   Discharge Planning:  [facility] for rehab with Palliative care service follow-up  Care plan was discussed with family.  Thank you for allowing the Palliative Medicine Team to assist in the care of this patient.  Total time spent:  35 min.     Greater than 50%  of this time was spent counseling and coordinating care related to the above assessment and plan.  Florentina Jenny, PA-C Palliative Medicine  Please contact Palliative MedicineTeam phone at [phone] for questions and concerns between 7 am - 7 pm.   Please see AMION for individual provider pager numbers.

## 2017-03-19 NOTE — Evaluation (Signed)
Pt PICC line out 3 cm after nursing performed dsg change. Original line was out by 1 cm. Cleaned site and applied new dsg and changed port. Line flushes well with excellent blood return. Advised nursing to order a new xray to assure placement is still in SVC. Will follow up based on xray results.

## 2017-03-19 NOTE — Progress Notes (Signed)
Anthony Burton  MRN: [id number]  DOB/AGE: 05/13/1940 77 y.o.  Primary Care Physician:Fanta, Tesfaye, MD  Admit date: 03/16/2017  Chief Complaint:  Chief Complaint  Patient presents with  . Fall    S-Pt presented on  03/16/2017 with  Chief Complaint  Patient presents with  . Fall  .    Pt is more alert,says I am doing okay   Meds . folic acid  1 mg Oral Daily  . megestrol  400 mg Oral Daily  . multivitamin with minerals  1 tablet Oral Daily  . nicotine  21 mg Transdermal Daily  . thiamine  100 mg Oral Daily   Or  . thiamine  100 mg Intravenous Daily      Physical Exam: Vital signs in last 24 hours: Temp:  [97.7 F (36.5 C)-99.1 F (37.3 C)] 98.2 F (36.8 C) (02/06 2831) Pulse Rate:  [87-102] 95 (02/06 0614) Resp:  [14-18] 14 (02/06 0614) BP: (100-127)/(54-76) 127/68 (02/06 0614) SpO2:  [98 %-100 %] 98 % (02/06 0614) Weight:  [124 lb 1.9 oz (56.3 kg)] 124 lb 1.9 oz (56.3 kg) (02/06 0614) Weight change: -8.7 oz (-0.7 kg) Last BM Date: 03/18/17  Intake/Output from previous day: 02/05 0701 - 02/06 0700 In: 1446.2 [P.O.:480; I.V.:966.2] Out: 650 [Urine:650] No intake/output data recorded.   Physical Exam: General- pt is lethargic but arousable Resp- No acute REsp distress, minimal Rhonchi CVS- S1S2 regular in rate and rhythm GIT- BS+, soft, NT, ND EXT- NO LE Edema, Cyanosis   Lab Results: CBC Recent Labs    03/16/17 2236  WBC 6.4  HGB 11.3*  HCT 32.0*  PLT 161    BMET Recent Labs    03/18/17 2125 03/19/17 0447  NA 118* 122*  K 3.8 3.6  CL 84* 88*  CO2 22 22  GLUCOSE 110* 98  BUN 9 7  CREATININE 0.93 0.87  CALCIUM 8.5* 8.3*    Sodium trend 2019  110==> 118=>122 2017  128 2014  132  MICRO No results found for this or any previous visit (from the past 240 hour(s)).    Lab Results  Component Value Date   CALCIUM 8.3 (L) 03/19/2017   PHOS 2.2 (L) 03/19/2017               Impression: 1)Hyponatremia    True  hyponatremia     Chronic hyponatremia     Hypovolemic hyponatremia     Improving slowly   2)CVS-Hemodynamically stable  3)Anemia HGb stable  4)Psych-Hx of Alc abuse Primary team following  5)Gout- no hx of acute gout attacks  PMD following  6)CNS-hx of Chronic b/l subdural hematoma Primary MD following   7)Acid base Co2 at goal     Plan:  Will continue current care     [place] S 03/19/2017, 9:30 AM

## 2017-03-19 NOTE — Progress Notes (Signed)
OT Cancellation Note  Patient Details Name: Anthony Burton MRN: [id number] DOB: 07-19-1940   Cancelled Treatment:    Reason Eval/Treat Not Completed: Fatigue/lethargy limiting ability to participate. OT attempted evaluation this am, pt lying across bed with legs dangling off side. Unable to follow simple instructions, very confused. OT assisted pt back into bed and to scoot to [facility]. Evaluation unable to be completed due to confusion and inability to participate and follow instructions. Will attempt evaluation at a later time.   Guadelupe Sabin, OTR/L  [phone] 03/19/2017, 8:24 AM

## 2017-03-19 NOTE — Progress Notes (Signed)
PT Cancellation Note  Patient Details Name: Anthony Burton MRN: [id number] DOB: 1941-01-02   Cancelled Treatment:    Reason Eval/Treat Not Completed: Fatigue/lethargy limiting ability to participate, possibly due to sedation at night.   Floria Raveling. Hartnett-Rands, MS, PT Per [place] [phone] 03/19/2017, 2:00 PM

## 2017-03-19 NOTE — Progress Notes (Signed)
PROGRESS NOTE    Patient: Anthony Burton     PCP: Rosita Fire, MD                    DOB: 1941-01-06            DOA: 03/16/2017 LPF:[id number]             DOS: 03/19/2017, 1:48 PM   Date of Service: the patient was seen and examined on 03/19/2017 Subjective:   Patient was seen and examined today.  Was able to answer a few questions appropriately, pleasantly confused laying in bed.  Diffuse cachexia noted.  Patient was being fed by family members. No issues overnight, mental status improving, no reported seizures overnight. Improved mentation, improved p.o. intake  Extensive family meeting at the bedside was held.  Patient's sister is willing to become her healthcare power of attorney for him. Family has expressed understanding the patient is not safe to return home.   ----------------------------------------------------------------------------------------------------------------------  Brief Narrative:   [name] is a 77 year old African-American male with extensive history of alcohol dependence/abuse, gout presented with cachexia, altered mental status, possible seizures.  Patient was found down at home covered in feces and urine.  Patient was noted for cachexia, progressive weight loss, and poor p.o. intake.  Poor hygiene.  Poor historian. Apparently has been living independently at home.  Was admitted through ED with mildly elevated troponin, sodium 110, CT of the head reporting chronic bilateral subdural fluid collection, Subsequently was admitted started on seizure medication neurology was consulted.    Principal Problem:   Hyponatremia Active Problems:   Gout   Alcohol abuse   Falls   Weakness   Elevated troponin   Bilateral subdural hematomas (HCC)   Maxillary sinusitis   Palliative care encounter   Malnutrition of moderate degree  Assessment & Plan:   Severe hyponatremia: -Much improved, sodium 122 today  Nephrology following  Last sodium prior to this  admission on 03/16/15: 128.   Presented with sodium of 110.  Briefly Sodium has remained in the 111-112 range.  Urine sodium <10.  Serum and urine osmolarity pending. Nephrology was consulted: Stopped normal saline and have started him on hypertonic saline via PICC line and have recommended serum sodium increase of no more than 1 mEq/h until sodium reaches 118-120 then switch over to normal saline.  Monitor BMP every 2 hours.  Discussed with Dr. Lowanda Foster, due to alcohol dependence, bilateral chronic subdural's at and severe hyponatremia, at risk for seizures hence was treated with hypertonic saline.    Etiology likely multifactorial due to beer potomania, dehydration.    SIADH workup, serum osmolarity 241, urine sodium less than 10 and urine osmolality 140, hypovolemic, hyponatremia improving sodium level  Alcohol dependence:  No overt withdrawal at this time but remains at high risk for same. CIWA protocol.  Gout: No acute flare.  Frequent falls: Likely multifactorial related to alcohol dependence, hyponatremia.  PT evaluation.  Elevated troponin: No chest pain reported.  Downward trend.  EKG without acute changes.  Check CK to rule out mild rhabdomyolysis related to falls.  Chronic bilateral subdural hematomas: As per CT, similar compared to previous head CT from 2017 suggesting chronic subdural collections.  No definite acute hemorrhage seen.  Follow as outpatient with periodic CT head.  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  Normocytic anemia: Follow CBCs periodically.   Hypoglycemia: Possibly due to poor oral intake in  the context of alcohol dependence.  Monitor CBGs closely.  Encourage oral diet.   DVT prophylaxis: SCDs Code Status: Full Family Communication:  No family members were present at bedside, Wilburn Mylar had an extensive family meeting, older sister agreed to be  active healthcare power of attorney Height of consulted to assist determination of patient's CODE STATUS and goals of care.  Disposition: To be determined pending clinical improvement and PT evaluation.   Consultants:  Nephrology  Procedures:  PICC line 03/17/17.  Antimicrobials:  None  Procedures:  No admission procedures for hospital encounter.   Antimicrobials:  Anti-infectives (From admission, onward)   None       Objective: Vitals:   03/18/17 1400 03/18/17 2239 03/19/17 0040 03/19/17 0614  BP: 118/63 (!) 119/54 100/76 127/68  Pulse: 87 100 (!) 102 95  Resp: 18 14 16 14   Temp: 97.8 F (36.6 C) 99.1 F (37.3 C)  98.2 F (36.8 C)  TempSrc: Oral Oral  Oral  SpO2: 100% 99% 98% 98%  Weight:    56.3 kg (124 lb 1.9 oz)  Height:        Intake/Output Summary (Last 24 hours) at 03/19/2017 1348 Last data filed at 03/19/2017 0636 Gross per 24 hour  Intake 1086.17 ml  Output 650 ml  Net 436.17 ml   Filed Weights   03/17/17 1351 03/18/17 0500 03/19/17 0614  Weight: 57 kg (125 lb 10.6 oz) 56 kg (123 lb 7.3 oz) 56.3 kg (124 lb 1.9 oz)    Examination:  BP 127/68 (BP Location: Right Arm)   Pulse 95   Temp 98.2 F (36.8 C) (Oral)   Resp 14   Ht 5\' 7"  (1.702 m)   Wt 56.3 kg (124 lb 1.9 oz)   SpO2 98%   BMI 19.44 kg/m    Physical Exam  Constitution: Confused but was able to follow commands, awake, alert HEENT: Normocephalic, PERRL, otherwise with in Normal limits  Vascular:  S1/S2, RRR, No murmure, No Rubs or Gallops  Chest/pulmonary: Clear to auscultation bilaterally, respirations unlabored  Chest symmetric Abdomen: Soft, non-tender, non-distended, bowel sounds,no masses, no organomegaly Muscular skeletal: Limited exam - in bed, able to move all 4 extremities, Normal strength,  Extremities: No pitting edema lower extremities, +2 pulses  Neuro: CNII-XII intact. , normal motor and sensation, reflexes intact  Skin: Dry, warm to touch, negative for any Rashes,  No open wounds Psychiatric: Confused, not combative or agitated at this time.  Poor insight,       Data Reviewed: I have personally reviewed following labs and imaging studies  CBC: Recent Labs  Lab 03/16/17 2236  WBC 6.4  NEUTROABS 4.9  HGB 11.3*  HCT 32.0*  MCV 82.3  PLT 193   Basic Metabolic Panel: Recent Labs  Lab 03/17/17 0946 03/17/17 1141 03/17/17 1334 03/17/17 1536 03/17/17 1711 03/17/17 2311 03/18/17 0456 03/18/17 2125 03/19/17 0447  NA 112* 112* 115* 117* 117* 116* 117* 118* 122*  K 3.7 3.8 4.6 4.1 3.8 3.9 4.1 3.8 3.6  CL 77* 77* 80* 81* 82* 82* 81* 84* 88*  CO2 22 23 23 23 23 26 24 22 22   GLUCOSE 94 82 73 71 73 84 82 110* 98  BUN 12 12 12 11 11 11 10 9 7   CREATININE 1.15 1.12 1.17 1.06 1.07 1.18 1.00 0.93 0.87  CALCIUM 8.5* 8.3* 9.0 8.7* 8.6* 8.7* 8.8* 8.5* 8.3*  PHOS 2.2* 2.2* 2.7 2.7  --   --   --   --  2.2*  GFR: Estimated Creatinine Clearance: 59.3 mL/min (by C-G formula based on SCr of 0.87 mg/dL). Liver Function Tests: Recent Labs  Lab 03/16/17 2236  03/17/17 1141 03/17/17 1334 03/17/17 1536 03/18/17 0456 03/19/17 0447  AST 93*  --   --   --   --  78*  --   ALT 19  --   --   --   --  21  --   ALKPHOS 73  --   --   --   --  60  --   BILITOT 1.5*  --   --   --   --  1.0  --   PROT 7.3  --   --   --   --  7.0  --   ALBUMIN 3.4*   < > 3.0* 3.4* 3.1* 3.1* 2.7*   < > = values in this interval not displayed.   No results for input(s): LIPASE, AMYLASE in the last 168 hours. No results for input(s): AMMONIA in the last 168 hours. Coagulation Profile: No results for input(s): INR, PROTIME in the last 168 hours. Cardiac Enzymes: Recent Labs  Lab 03/16/17 2236 03/17/17 0234 03/17/17 0625 03/17/17 1334  CKTOTAL  --   --   --  1,193*  TROPONINI 0.11* 0.08* 0.06* 0.05*   BNP (last 3 results) No results for input(s): PROBNP in the last 8760 hours. HbA1C: No results for input(s): HGBA1C in the last 72 hours. CBG: Recent Labs  Lab  03/18/17 2034 03/19/17 0014 03/19/17 0345 03/19/17 0740 03/19/17 1153  GLUCAP 123* 98 91 93 105*   Lipid Profile: No results for input(s): CHOL, HDL, LDLCALC, TRIG, CHOLHDL, LDLDIRECT in the last 72 hours. Thyroid Function Tests: Recent Labs    03/17/17 0234  TSH 1.182   Anemia Panel: No results for input(s): VITAMINB12, FOLATE, FERRITIN, TIBC, IRON, RETICCTPCT in the last 72 hours. Sepsis Labs: Recent Labs  Lab 03/17/17 0019  LATICACIDVEN 1.84    No results found for this or any previous visit (from the past 240 hour(s)).     Radiology Studies: Dg Chest 1 View  Result Date: 03/18/2017 CLINICAL DATA:  PICC line placement. EXAM: CHEST 1 VIEW COMPARISON:  03/16/2017 and prior radiograph FINDINGS: A right PICC line is identified with tip overlying the lower SVC. The cardiomediastinal silhouette is unchanged. There is no evidence of focal airspace disease, pulmonary edema, suspicious pulmonary nodule/mass, pleural effusion, or pneumothorax. No acute bony abnormalities are identified. IMPRESSION: Right PICC line with tip overlying the lower SVC. Electronically Signed   By: Margarette Canada M.D.   On: 03/18/2017 18:06    Scheduled Meds: . folic acid  1 mg Oral Daily  . megestrol  400 mg Oral Daily  . multivitamin with minerals  1 tablet Oral Daily  . nicotine  21 mg Transdermal Daily  . thiamine  100 mg Oral Daily   Or  . thiamine  100 mg Intravenous Daily   Continuous Infusions: . sodium chloride 55 mL/hr at 03/19/17 0357     LOS: 2 days    Time spent: >25 minutes   Deatra James, MD [facility] Pager [phone]  If 7PM-7AM, please contact night-coverage [email or web address] Password [email or web address] 03/19/2017, 1:48 PM

## 2017-03-20 DIAGNOSIS — E440 Moderate protein-calorie malnutrition: Secondary | ICD-10-CM

## 2017-03-20 DIAGNOSIS — W19XXXD Unspecified fall, subsequent encounter: Secondary | ICD-10-CM

## 2017-03-20 LAB — GLUCOSE, CAPILLARY
GLUCOSE-CAPILLARY: 91 mg/dL (ref 65–99)
GLUCOSE-CAPILLARY: 100 mg/dL — AB (ref 65–99)
Glucose-Capillary: 135 mg/dL — ABNORMAL HIGH (ref 65–99)
Glucose-Capillary: 105 mg/dL — ABNORMAL HIGH (ref 65–99)

## 2017-03-20 LAB — BASIC METABOLIC PANEL
Anion gap: 6 (ref 5–15)
BUN: <5 mg/dL — ABNORMAL LOW (ref 6–20)
CHLORIDE: 103 mmol/L (ref 101–111)
CO2: 19 mmol/L — ABNORMAL LOW (ref 22–32)
CREATININE: 0.55 mg/dL — AB (ref 0.61–1.24)
Calcium: 5.9 mg/dL — CL (ref 8.9–10.3)
Glucose, Bld: 106 mg/dL — ABNORMAL HIGH (ref 65–99)
POTASSIUM: 2.4 mmol/L — AB (ref 3.5–5.1)
SODIUM: 128 mmol/L — AB (ref 135–145)

## 2017-03-20 MED ORDER — POTASSIUM CHLORIDE CRYS ER 20 MEQ PO TBCR
40.0000 meq | EXTENDED_RELEASE_TABLET | Freq: Two times a day (BID) | ORAL | Status: DC
  Administered 2017-03-20: 40 meq via ORAL
  Filled 2017-03-20: qty 2

## 2017-03-20 MED ORDER — POTASSIUM CHLORIDE CRYS ER 20 MEQ PO TBCR: 40.0000 meq | EXTENDED_RELEASE_TABLET | Freq: Once | ORAL | 0 refills | Status: DC

## 2017-03-20 MED ORDER — CHLORHEXIDINE GLUCONATE 0.12 % MT SOLN: 15.0000 mL | Freq: Two times a day (BID) | OROMUCOSAL | 0 refills | Status: AC

## 2017-03-20 MED ORDER — CIPROFLOXACIN HCL 500 MG PO TABS: 500.0000 mg | ORAL_TABLET | Freq: Two times a day (BID) | ORAL | 0 refills | Status: AC

## 2017-03-20 MED ORDER — CHLORHEXIDINE GLUCONATE 0.12 % MT SOLN
15.0000 mL | Freq: Two times a day (BID) | OROMUCOSAL | Status: DC
  Administered 2017-03-20: 15 mL via OROMUCOSAL
  Filled 2017-03-20: qty 15

## 2017-03-20 MED ORDER — CALCIUM CARBONATE 1250 (500 CA) MG PO TABS
1000.0000 mg | ORAL_TABLET | Freq: Two times a day (BID) | ORAL | Status: DC
  Filled 2017-03-20: qty 1

## 2017-03-20 MED ORDER — POTASSIUM CHLORIDE CRYS ER 20 MEQ PO TBCR
40.0000 meq | EXTENDED_RELEASE_TABLET | Freq: Once | ORAL | Status: AC
  Administered 2017-03-20: 40 meq via ORAL
  Filled 2017-03-20: qty 2

## 2017-03-20 MED ORDER — POTASSIUM CHLORIDE 10 MEQ/100ML IV SOLN
10.0000 meq | INTRAVENOUS | Status: AC
  Administered 2017-03-20 (×3): 10 meq via INTRAVENOUS
  Filled 2017-03-20 (×3): qty 100

## 2017-03-20 MED ORDER — ADULT MULTIVITAMIN W/MINERALS CH: 1.0000 | ORAL_TABLET | Freq: Every day | ORAL | 0 refills | Status: DC

## 2017-03-20 MED ORDER — NICOTINE 21 MG/24HR TD PT24: 21.0000 mg | MEDICATED_PATCH | Freq: Every day | TRANSDERMAL | 0 refills | Status: DC

## 2017-03-20 MED ORDER — ORAL CARE MOUTH RINSE
15.0000 mL | Freq: Two times a day (BID) | OROMUCOSAL | Status: DC
  Administered 2017-03-20: 15 mL via OROMUCOSAL

## 2017-03-20 MED ORDER — CALCIUM CARBONATE 1250 (500 CA) MG PO TABS: 1.0000 | ORAL_TABLET | Freq: Two times a day (BID) | ORAL | 0 refills | Status: DC

## 2017-03-20 MED ORDER — CIPROFLOXACIN HCL 250 MG PO TABS
500.0000 mg | ORAL_TABLET | Freq: Two times a day (BID) | ORAL | Status: DC
  Administered 2017-03-20: 500 mg via ORAL
  Filled 2017-03-20: qty 2

## 2017-03-20 MED ORDER — FOLIC ACID 1 MG PO TABS: 1.0000 mg | ORAL_TABLET | Freq: Every day | ORAL | 0 refills | Status: AC

## 2017-03-20 MED ORDER — THIAMINE HCL 100 MG PO TABS: 100.0000 mg | ORAL_TABLET | Freq: Every day | ORAL | 0 refills | Status: DC

## 2017-03-20 MED ORDER — MEGESTROL ACETATE 40 MG/ML PO SUSP: 400.0000 mg | Freq: Every day | ORAL | 0 refills | Status: DC

## 2017-03-20 NOTE — Progress Notes (Signed)
Patient's IV and PICC removed by Barbra Sarks, RN. Patient tolerated well.  Patient to be transferred to [place] by his niece.  Report called to Arbie Cookey, LPN at [facility].

## 2017-03-20 NOTE — Clinical Social Work Placement (Signed)
   CLINICAL SOCIAL WORK PLACEMENT  NOTE  Date:  03/20/2017  Patient Details  Name: Anthony Burton MRN: [id number] Date of Birth: 10/17/1940  Clinical Social Work is seeking post-discharge placement for this patient at the [place] level of care (*CSW will initial, date and re-position this form in  chart as items are completed):  Yes   Patient/family provided with [facility] Work Department's list of facilities offering this level of care within the geographic area requested by the patient (or if unable, by the patient's family).  Yes   Patient/family informed of their freedom to choose among providers that offer the needed level of care, that participate in Medicare, Medicaid or managed care program needed by the patient, have an available bed and are willing to accept the patient.  Yes   Patient/family informed of [facility]'s ownership interest in [facility] and [facility], as well as of the fact that they are under no obligation to receive care at these facilities.  PASRR submitted to EDS on 03/20/17     PASRR number received on 03/20/17     Existing PASRR number confirmed on       FL2 transmitted to all facilities in geographic area requested by pt/family on 03/20/17     FL2 transmitted to all facilities within larger geographic area on       Patient informed that his/her managed care company has contracts with or will negotiate with certain facilities, including the following:        Yes   Patient/family informed of bed offers received.  Patient chooses bed at Other - please specify in the comment section below:([facility])     Physician recommends and patient chooses bed at      Patient to be transferred to Other - please specify in the comment section below:([facility]) on 03/20/17.  Patient to be transferred to facility by family     Patient family notified on 03/20/17 of transfer.  Name of family member notified:  [place] (sister)      PHYSICIAN       Additional Comment: PASARR number received and pt can go to [facility]. Updated pt's sister [place]. Pt's niece will transport. Updated pt's RN. He will call report and assist pt into niece's car. There are no other SW needs for dc.   _______________________________________________ Shade Flood, LCSW 03/20/2017, 4:31 PM

## 2017-03-20 NOTE — Progress Notes (Signed)
PT Cancellation Note  Patient Details Name: Anthony Burton MRN: [id number] DOB: 03-06-1940   Cancelled Treatment:    Reason Eval/Treat Not Completed: Medical issues which prohibited therapy.  Critical K+ and Ca+ were noted, will try later as time and pt allow.   Ramond Dial 03/20/2017, 9:25 AM   Mee Hives, PT MS Acute Rehab Dept. Number: [place] [facility] [place]

## 2017-03-20 NOTE — Discharge Summary (Addendum)
Physician Discharge Summary      Patient: Anthony Burton                   Admit date: 03/16/2017   DOB: 1941-01-19             Discharge date:03/20/2017/1:00 PM RUE:[id number]                           PCP: Rosita Fire, MD Recommendations for Outpatient Follow-up:   1.       Follow-up with palliative at SNF 2. Please follow-up with your primary care physician within 1-2 weeks.   Discharge Condition: Stable  CODE STATUS:  Full code Diet recommendation:   Regular healthy diet  --------------------------------------------------------------------------------------------------------------------- Discharge Diagnoses:   Principal Problem:   Hyponatremia Active Problems:   Gout   Alcohol abuse   Falls   Weakness   Elevated troponin   Bilateral subdural hematomas (HCC)   Maxillary sinusitis   Palliative care encounter   Malnutrition of moderate degree  History of present illness :  Anthony Burton is a 77 y.o. male with medical history significant for alcohol abuse who presents with worsening weakness and recurrent falls.  He was apparently found down at home and was covered in feces and urine.  He is also had some decreased oral intake as well as weight loss noted.  He is difficult to obtain any history from and there are no family members currently at the bedside, but reportedly he is at his baseline level of functioning.  Patient denies any chest pain, cough, fever, or chills.   ED Course: Patient is noted to have troponin of 0.11 and sodium of 110.  Chest x-ray demonstrates a mild left basilar opacity, but does not appear suggestive of pneumonia.  Head CT with noted chronic bilateral subdural fluid collections as well as brain atrophy and right maxillary sinus expansion with complex fluid collection.  Patient has not been started on any medications or IV fluid.  Vital signs are stable.  Hospital course / Brief Summary:   Severe hyponatremia:-Continues to improve, sodium  level at 128 Nephrology was following Last sodium prior to this admission on 03/16/15: 128.  Presented with sodium of 110. Briefly Sodium has remained in the 111-112 range. Urine sodium <10. Serum and urine osmolarity pending. Nephrology was consulted: Stopped normal saline and have started him on hypertonic saline via PICC line and have recommended serum sodium increase of no more than 1 mEq/h until sodium reaches 118-120 then switch over to normal saline. Monitor BMP every 2 hours. Discussed with Dr. Lowanda Foster, due to alcohol dependence, bilateral chronic subdural'sat and severe hyponatremia, at risk for seizures hence was treated with hypertonic saline.  Etiology likely multifactorial due to beer potomania, dehydration.   SIADH workup; serum osmolarity 241, urine sodium less than 10 and urine osmolality 140, there fore hypovolemic, hyponatremia improving sodium level  Hypokalemia/hypocalcemia -replacing potassium IV then p.o., continue with addition of calcium carbonate per nephrology recommendation Improving nutrition his albumin is also still low.  Alcohol dependence: No overt withdrawal at this time but remains at high risk for same.was on  CIWA  UTI - cont. Abx   Gout: No acute flare.  Frequent falls:Likely multifactorial related to alcohol dependence, hyponatremia. PT evaluated.  Elevated troponin: No chest pain reported. Downward trend. EKG without acute changes. Check CK to rule out mild rhabdomyolysis related to falls.  Chronic bilateral subdural hematomas:As per CT, similar  compared to previous head CT from 2017 suggesting chronic subdural collections. No definite acute hemorrhage seen. Follow as outpatient with periodic CT head.  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  Normocytic anemia: HH stable.   Hypoglycemia: Possibly due to poor  oral intake in the context of alcohol dependence. Monitor  Encourage oral diet.  Social-extensive family conversation/meetings were held older sister is agreeable to become active healthcare power of attorney.  The above findings  were discussed with the patient's sister in detail she currently stressed understanding and agreement she is to meet with the palliative care regarding determine CODE STATUS.  We are recommending DNR/DNI status, palliative care team to follow pursue with hospice  Disposition -  patient has been cleared to be discharged to SNF  Consultations:   Nephrology  Palliative care team  Procedures: No admission procedures for hospital encounter.  --------------------------------------------------------------------------------------------------------------------- Discharge Instructions:   Discharge Instructions    Activity as tolerated - No restrictions   Complete by:  As directed    Diet - low sodium heart healthy   Complete by:  As directed    Discharge instructions   Complete by:  As directed    F/up with palliative care --- anticipating transition to hospice   Increase activity slowly   Complete by:  As directed        Medication List    STOP taking these medications   GOODY BODY PAIN 500-325 MG Pack Generic drug:  Aspirin-Acetaminophen     TAKE these medications   aspirin 325 MG tablet Take 650 mg by mouth daily.   calcium carbonate 1250 (500 Ca) MG tablet Commonly known as:  OS-CAL - dosed in mg of elemental calcium Take 1 tablet (500 mg of elemental calcium total) by mouth 2 (two) times daily with a meal.   chlordiazePOXIDE 25 MG capsule Commonly known as:  LIBRIUM 1 po every 8 hours for anxiety   chlorhexidine 0.12 % solution Commonly known as:  PERIDEX 15 mLs by Mouth Rinse route 2 (two) times daily.   ciprofloxacin 500 MG tablet Commonly known as:  CIPRO Take 1 tablet (500 mg total) by mouth 2 (two) times daily for 14 days.     folic acid 1 MG tablet Commonly known as:  FOLVITE Take 1 tablet (1 mg total) by mouth daily. Start taking on:  03/21/2017   megestrol 40 MG/ML suspension Commonly known as:  MEGACE Take 10 mLs (400 mg total) by mouth daily.   multivitamin with minerals Tabs tablet Take 1 tablet by mouth daily. Start taking on:  03/21/2017   nicotine 21 mg/24hr patch Commonly known as:  NICODERM CQ - dosed in mg/24 hours Place 1 patch (21 mg total) onto the skin daily. Start taking on:  03/21/2017   potassium chloride SA 20 MEQ tablet Commonly known as:  K-DUR,KLOR-CON Take 2 tablets (40 mEq total) by mouth once for 1 dose.   thiamine 100 MG tablet Take 1 tablet (100 mg total) by mouth daily. Start taking on:  03/21/2017       No Known Allergies    Procedures/Studies: Dg Chest 1 View  Result Date: 03/19/2017 CLINICAL DATA:  Recheck PICC line EXAM: CHEST 1 VIEW COMPARISON:  03/18/2017 FINDINGS: Right arm PICC line is again identified and stable at the cavoatrial junction. The cardiac shadow is within normal limits. No focal infiltrate is seen. No bony abnormality is noted. IMPRESSION: PICC line in satisfactory position Electronically Signed  By: Inez Catalina M.D.   On: 03/19/2017 16:00   Dg Chest 1 View  Result Date: 03/18/2017 CLINICAL DATA:  PICC line placement. EXAM: CHEST 1 VIEW COMPARISON:  03/16/2017 and prior radiograph FINDINGS: A right PICC line is identified with tip overlying the lower SVC. The cardiomediastinal silhouette is unchanged. There is no evidence of focal airspace disease, pulmonary edema, suspicious pulmonary nodule/mass, pleural effusion, or pneumothorax. No acute bony abnormalities are identified. IMPRESSION: Right PICC line with tip overlying the lower SVC. Electronically Signed   By: Margarette Canada M.D.   On: 03/18/2017 18:06   Dg Chest 2 View  Result Date: 03/16/2017 CLINICAL DATA:  Weakness EXAM: CHEST  2 VIEW COMPARISON:  03/16/2015 FINDINGS: Hyperinflation with  emphysematous changes. Mild ground-glass opacity at the left lung base. No pleural effusion. Normal heart size. Aortic atherosclerosis. No pneumothorax. IMPRESSION: 1. Hyperinflation with emphysematous disease 2. Mild left basilar opacity, possible infiltrate. Electronically Signed   By: Donavan Foil M.D.   On: 03/16/2017 23:35   Ct Head Wo Contrast  Result Date: 03/17/2017 CLINICAL DATA:  [place] at home EXAM: CT HEAD WITHOUT CONTRAST TECHNIQUE: Contiguous axial images were obtained from the base of the skull through the vertex without intravenous contrast. COMPARISON:  03/16/2015 CT brain FINDINGS: Brain: No acute territorial infarction or intracranial mass is visualized. Slight asymmetric enlargement CSF density anterior to the left temporal lobe could reflect a small arachnoid cysts, no change. Enlarged extra-axial CSF densities anteriorly. Slightly more dense subdural collections along the frontal convexities, measuring up to 4 mm thick on the right and 3 mm on the left, similar appearance compared to the previous CT. No significant mass effect. No hyperdensity in the subdural collections to suggest acute bleeding. Moderate to marked atrophy. Moderate small vessel ischemic changes of the white matter. Stable ventricle size. Vascular: No hyperdense vessels. Scattered calcifications at the carotid siphons. Skull: No depressed skull fracture. Sinuses/Orbits: Completely opacified right frontal sinus. Mucosal thickening in the right ethmoid sinus. Expanded right maxillary sinus containing gas and complex secretions and possible calcifications. Other: None IMPRESSION: 1. Bilateral subdural fluid collections, right greater than left, with slight isodense appearance of the right collection but similar compared to the previous head CT from 2017 suggesting chronic subdural collections. No definite acute hemorrhage is seen 2. Atrophy and small vessel ischemic changes of the white matter 3. Expanded right maxillary sinus  with complex secretions and possible calcifications. Suggest nonemergent sinus CT to more thoroughly evaluate. Electronically Signed   By: Donavan Foil M.D.   On: 03/17/2017 01:40   Ct Maxillofacial W Contrast  Result Date: 03/17/2017 CLINICAL DATA:  Multiple falls, follow-up sinus disease. EXAM: CT MAXILLOFACIAL WITH CONTRAST TECHNIQUE: Multidetector CT imaging of the maxillofacial structures was performed with intravenous contrast. Multiplanar CT image reconstructions were also generated. CONTRAST:  101mL ISOVUE-300 IOPAMIDOL (ISOVUE-300) INJECTION 61% COMPARISON:  CT HEAD March 17, 2017 FINDINGS: OSSEOUS: The mandible is intact, the condyles are located. No acute facial fracture. Old bilateral nasal bone fractures. No destructive bony lesions. Poor dentition with multiple dental caries and periapical abscess. Multilevel moderate to severe cervical spondylosis. Severe bilateral C3-4, C4-5 and LEFT C5-6 neural foraminal narrowing. 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 ORBITS: Ocular globes intact. 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 Preservation the orbital fat. Normal appearance optic nerve sheath complexes and extra-ocular muscles. SINUSES: Expanded RIGHT maxillary sinus with heterogeneous contents, curvilinear densities without definite enhancement. Due to expansion, the RIGHT middle and inferior turbinates are not well characterized and, nasal  recess effaced. 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 Mild RIGHT ethmoid mucosal thickening. Sphenoid sinuses and LEFT maxillary sinus well aerated. SOFT TISSUES: No significant soft tissue swelling. No subcutaneous gas or radiopaque foreign bodies. LIMITED INTRACRANIAL: Bilateral subdural hematomas as seen on today's CT HEAD. IMPRESSION: 1. Chronic RIGHT frontal and maxillary sinusitis. 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 Recommend nonemergent ENT  consultation. 2. 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 Severe C3-4 through C5-6 neural foraminal narrowing. Electronically Signed   By: Elon Alas M.D.   On: 03/17/2017 03:41     Subjective: Patient was seen and examined 03/20/2017, 1:00 PM Patient stable  Today. No acute distress.  No issues overnight Stable for discharge.  Discharge Exam:  Vitals:   03/19/17 1958 03/19/17 2226 03/20/17 0500 03/20/17 0519  BP: 121/69   131/78  Pulse: 87   88  Resp: 15   16  Temp: 98.4 F (36.9 C)   98.2 F (36.8 C)  TempSrc: Oral   Oral  SpO2: 100% 99%  100%  Weight:   56.9 kg (125 lb 7.1 oz)   Height:        General: Pt lying comfortably in bed & appears in no obvious distress. Cardiovascular: S1 & S2 heard, RRR, S1/S2 +. No murmurs, rubs, gallops or clicks. No JVD or pedal edema. Respiratory: Clear to auscultation without wheezing, rhonchi or crackles. No increased work of breathing. Abdominal:  Non distended, non tender & soft. No organomegaly or masses appreciated. Normal bowel sounds heard. CNS: Alert and oriented. No focal deficits. Extremities: no edema, no cyanosis    The results of significant diagnostics from this hospitalization (including imaging, microbiology, ancillary and laboratory) are listed below for reference.     Microbiology: No results found for this or any previous visit (from the past 240 hour(s)).   Labs: CBC: Recent Labs  Lab 03/16/17 2236  WBC 6.4  NEUTROABS 4.9  HGB 11.3*  HCT 32.0*  MCV 82.3  PLT 974   Basic Metabolic Panel: Recent Labs  Lab 03/17/17 0946 03/17/17 1141 03/17/17 1334 03/17/17 1536  03/17/17 2311 03/18/17 0456 03/18/17 2125 03/19/17 0447 03/20/17 0638  NA 112* 112* 115* 117*   < > 116* 117* 118* 122* 128*  K 3.7 3.8 4.6 4.1   < > 3.9 4.1 3.8 3.6 2.4*  CL 77* 77* 80* 81*   < > 82* 81* 84* 88* 103  CO2 22 23 23 23    < > 26 24 22 22  19*  GLUCOSE 94 82 73 71   < > 84 82 110* 98 106*  BUN 12 12 12 11    < > 11 10 9 7  <5*    CREATININE 1.15 1.12 1.17 1.06   < > 1.18 1.00 0.93 0.87 0.55*  CALCIUM 8.5* 8.3* 9.0 8.7*   < > 8.7* 8.8* 8.5* 8.3* 5.9*  PHOS 2.2* 2.2* 2.7 2.7  --   --   --   --  2.2*  --    < > = values in this interval not displayed.   Liver Function Tests: Recent Labs  Lab 03/16/17 2236  03/17/17 1141 03/17/17 1334 03/17/17 1536 03/18/17 0456 03/19/17 0447  AST 93*  --   --   --   --  78*  --   ALT 19  --   --   --   --  21  --   ALKPHOS 73  --   --   --   --  60  --   BILITOT 1.5*  --   --   --   --  1.0  --   PROT 7.3  --   --   --   --  7.0  --   ALBUMIN 3.4*   < > 3.0* 3.4* 3.1* 3.1* 2.7*   < > = values in this interval not displayed.   BNP (last 3 results) No results for input(s): BNP in the last 8760 hours. Cardiac Enzymes: Recent Labs  Lab 03/16/17 2236 03/17/17 0234 03/17/17 0625 03/17/17 1334  CKTOTAL  --   --   --  1,193*  TROPONINI 0.11* 0.08* 0.06* 0.05*   CBG: Recent Labs  Lab 03/19/17 2003 03/19/17 2341 03/20/17 0519 03/20/17 0736 03/20/17 1152  GLUCAP 120* 101* 91 100* 135*   Urinalysis    Component Value Date/Time   COLORURINE YELLOW 03/17/2017 0040   APPEARANCEUR HAZY (A) 03/17/2017 0040   LABSPEC 1.006 03/17/2017 0040   PHURINE 6.0 03/17/2017 0040   GLUCOSEU NEGATIVE 03/17/2017 0040   HGBUR MODERATE (A) 03/17/2017 0040   BILIRUBINUR NEGATIVE 03/17/2017 0040   KETONESUR 5 (A) 03/17/2017 0040   PROTEINUR NEGATIVE 03/17/2017 0040   NITRITE POSITIVE (A) 03/17/2017 0040   LEUKOCYTESUR MODERATE (A) 03/17/2017 0040      Time coordinating discharge: Over 30 minutes  SIGNED: Deatra James, MD, FACP, FHM. [facility] Pager [phone]  If 7PM-7AM, please contact night-coverage [email or web address] Password [email or web address] 03/20/2017, 1:00 PM

## 2017-03-20 NOTE — Progress Notes (Signed)
Subjective: Interval History: Patient is feeling better.  Presently he offers no complaints.  He does not have any nausea or vomiting.  Objective: Vital signs in last 24 hours: Temp:  [98.2 F (36.8 C)-98.5 F (36.9 C)] 98.2 F (36.8 C) (02/07 0519) Pulse Rate:  [87-98] 88 (02/07 0519) Resp:  [15-16] 16 (02/07 0519) BP: (121-136)/(68-78) 131/78 (02/07 0519) SpO2:  [99 %-100 %] 100 % (02/07 0519) Weight:  [56.9 kg (125 lb 7.1 oz)] 56.9 kg (125 lb 7.1 oz) (02/07 0500) Weight change: 0.6 kg (1 lb 5.2 oz)  Intake/Output from previous day: 02/06 0701 - 02/07 0700 In: 1380 [P.O.:720; I.V.:660] Out: 700 [Urine:700] Intake/Output this shift: Total I/O In: 50 [P.O.:50] Out: -   General appearance: alert, cooperative and no distress Resp: clear to auscultation bilaterally Cardio: regular rate and rhythm Extremities: No edema  Lab Results: No results for input(s): WBC, HGB, HCT, PLT in the last 72 hours. BMET:  Recent Labs    03/19/17 0447 03/20/17 0638  NA 122* 128*  K 3.6 2.4*  CL 88* 103  CO2 22 19*  GLUCOSE 98 106*  BUN 7 <5*  CREATININE 0.87 0.55*  CALCIUM 8.3* 5.9*   No results for input(s): PTH in the last 72 hours. Iron Studies: No results for input(s): IRON, TIBC, TRANSFERRIN, FERRITIN in the last 72 hours.  Studies/Results: Dg Chest 1 View  Result Date: 03/19/2017 CLINICAL DATA:  Recheck PICC line EXAM: CHEST 1 VIEW COMPARISON:  03/18/2017 FINDINGS: Right arm PICC line is again identified and stable at the cavoatrial junction. The cardiac shadow is within normal limits. No focal infiltrate is seen. No bony abnormality is noted. IMPRESSION: PICC line in satisfactory position Electronically Signed   By: Inez Catalina M.D.   On: 03/19/2017 16:00   Dg Chest 1 View  Result Date: 03/18/2017 CLINICAL DATA:  PICC line placement. EXAM: CHEST 1 VIEW COMPARISON:  03/16/2017 and prior radiograph FINDINGS: A right PICC line is identified with tip overlying the lower SVC. The  cardiomediastinal silhouette is unchanged. There is no evidence of focal airspace disease, pulmonary edema, suspicious pulmonary nodule/mass, pleural effusion, or pneumothorax. No acute bony abnormalities are identified. IMPRESSION: Right PICC line with tip overlying the lower SVC. Electronically Signed   By: Margarette Canada M.D.   On: 03/18/2017 18:06    I have reviewed the patient's current medications.  Assessment/Plan: 1] hyponatremia: His serum osmolality is 241 hence 2 hyponatremia.  His urine sodium is less than 10 and urine osmolality is 140.  Hence hypovolemic hyponatremia.  Presently his sodium has improved is 128 2] history of alcohol abuse  3] history of weakness and fall. 4] history of gout: His uric acid is 5.6 5] hypokalemia: His potassium has declined. 6] hypocalcemia: His calcium seems to be declining.  His last albumin was 3.1.  Corrected for the albumin still calcium seems to be low. Plan: We will give KCl 10 mEq IV x3 doses 2] we will start patient on KCl 40 mEq p.o. twice daily 3] we will start patient on calcium carbonate 600 mg 2 tablet p.o. twice daily 4] we will check his renal panel in the morning.   LOS: 3 days   Azelyn Batie S 03/20/2017,10:57 AM

## 2017-03-20 NOTE — Care Management Important Message (Signed)
Important Message  Patient Details  Name: Anthony Burton MRN: [id number] Date of Birth: 12/27/1940   Medicare Important Message Given:  Yes    Sherald Barge, RN 03/20/2017, 1:59 PM

## 2017-03-20 NOTE — Progress Notes (Signed)
PROGRESS NOTE    Patient: Anthony Burton     PCP: Rosita Fire, MD                    DOB: Dec 25, 1940            DOA: 03/16/2017 ZYS:[id number]             DOS: 03/20/2017, 11:18 AM   Date of Service: the patient was seen and examined on 03/20/2017 Subjective:  Patient was seen and examined this morning, stable much more awake, able to participate in exam.  Follow commands. ----------------------------------------------------------------------------------------------------------------------  Brief Narrative:   [name] is a 77 year old African-American male with extensive history of alcohol dependence/abuse, gout presented with cachexia, altered mental status, possible seizures.  Patient was found down at home covered in feces and urine.  Patient was noted for cachexia, progressive weight loss, and poor p.o. intake.  Poor hygiene.  Poor historian. Apparently has been living independently at home.  Was admitted through ED with mildly elevated troponin, sodium 110, CT of the head reporting chronic bilateral subdural fluid collection, Subsequently was admitted started on seizure medication neurology was consulted. -----------------------------------------------------------------------------------------------------------------------------  Principal Problem:   Hyponatremia Active Problems:   Gout   Alcohol abuse   Falls   Weakness   Elevated troponin   Bilateral subdural hematomas (HCC)   Maxillary sinusitis   Palliative care encounter   Malnutrition of moderate degree  Assessment & Plan:   Severe hyponatremia: -Continues to improve, sodium level at 128 Nephrology following  Last sodium prior to this admission on 03/16/15: 128.   Presented with sodium of 110.  Briefly Sodium has remained in the 111-112 range.  Urine sodium <10.  Serum and urine osmolarity pending. Nephrology was consulted: Stopped normal saline and have started him on hypertonic saline via PICC line and have  recommended serum sodium increase of no more than 1 mEq/h until sodium reaches 118-120 then switch over to normal saline.  Monitor BMP every 2 hours.  Discussed with Dr. Lowanda Foster, due to alcohol dependence, bilateral chronic subdural's at and severe hyponatremia, at risk for seizures hence was treated with hypertonic saline.    Etiology likely multifactorial due to beer potomania, dehydration.    SIADH workup; serum osmolarity 241, urine sodium less than 10 and urine osmolality 140, there fore hypovolemic, hyponatremia improving sodium level  Hypokalemia/hypocalcemia -replacing potassium IV then p.o., continue with addition of calcium carbonate per GI recommendation Improving nutrition his albumin is also still low.  Alcohol dependence:  No overt withdrawal at this time but remains at high risk for same. CIWA protocol.  Gout: No acute flare.  Frequent falls: Likely multifactorial related to alcohol dependence, hyponatremia.  PT evaluation.  Elevated troponin: No chest pain reported.  Downward trend.  EKG without acute changes.  Check CK to rule out mild rhabdomyolysis related to falls.  Chronic bilateral subdural hematomas: As per CT, similar compared to previous head CT from 2017 suggesting chronic subdural collections.  No definite acute hemorrhage seen.  Follow as outpatient with periodic CT head.  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  Normocytic anemia: Follow CBCs periodically.   Hypoglycemia: Possibly due to poor oral intake in the context of alcohol dependence.  Monitor CBGs closely.  Encourage oral diet.   DVT prophylaxis: SCDs Code Status: Full Family Communication:  No family members were present at bedside, Prior extensive family meeting, older sister agreed to be  active healthcare power of attorney Height of consulted to assist determination of patient's  CODE STATUS and goals of care.  Disposition: planning to D/C to SNF     Consultants:  Nephrology  Procedures:  PICC line 03/17/17.  Antimicrobials:  None  Procedures:  No admission procedures for hospital encounter.   Antimicrobials:  Anti-infectives (From admission, onward)   None       Objective: Vitals:   03/19/17 1958 03/19/17 2226 03/20/17 0500 03/20/17 0519  BP: 121/69   131/78  Pulse: 87   88  Resp: 15   16  Temp: 98.4 F (36.9 C)   98.2 F (36.8 C)  TempSrc: Oral   Oral  SpO2: 100% 99%  100%  Weight:   56.9 kg (125 lb 7.1 oz)   Height:        Intake/Output Summary (Last 24 hours) at 03/20/2017 1118 Last data filed at 03/20/2017 0175 Gross per 24 hour  Intake 1430 ml  Output 700 ml  Net 730 ml   Filed Weights   03/18/17 0500 03/19/17 0614 03/20/17 0500  Weight: 56 kg (123 lb 7.3 oz) 56.3 kg (124 lb 1.9 oz) 56.9 kg (125 lb 7.1 oz)    Examination:  BP 131/78 (BP Location: Right Wrist)   Pulse 88   Temp 98.2 F (36.8 C) (Oral)   Resp 16   Ht 5\' 7"  (1.702 m)   Wt 56.9 kg (125 lb 7.1 oz)   SpO2 100%   BMI 19.65 kg/m    Physical Exam  BP 131/78 (BP Location: Right Wrist)   Pulse 88   Temp 98.2 F (36.8 C) (Oral)   Resp 16   Ht 5\' 7"  (1.702 m)   Wt 56.9 kg (125 lb 7.1 oz)   SpO2 100%   BMI 19.65 kg/m    Physical Exam  Constitution: Much improved mentation, comfortable able to communicate this morning HEENT: Normocephalic, PERRL, otherwise with in Normal limits  Vascular:  S1/S2, RRR, No murmure, No Rubs or Gallops  Chest/pulmonary: Clear to auscultation bilaterally, respirations unlabored  Chest symmetric Abdomen: Soft, non-tender, non-distended, bowel sounds,no masses, no organomegaly Muscular skeletal: Limited exam - in bed, able to move all 4 extremities, Normal strength,  Extremities: No pitting edema lower extremities, +2 pulses  Neuro: CNII-XII intact. , normal motor and sensation, reflexes intact  Skin: Dry, warm to touch,  negative for any Rashes, No open wounds Psychiatric: stable mood poor cognition intact,        Data Reviewed: I have personally reviewed following labs and imaging studies  CBC: Recent Labs  Lab 03/16/17 2236  WBC 6.4  NEUTROABS 4.9  HGB 11.3*  HCT 32.0*  MCV 82.3  PLT 102   Basic Metabolic Panel: Recent Labs  Lab 03/17/17 0946 03/17/17 1141 03/17/17 1334 03/17/17 1536  03/17/17 2311 03/18/17 0456 03/18/17 2125 03/19/17 0447 03/20/17 0638  NA 112* 112* 115* 117*   < > 116* 117* 118* 122* 128*  K 3.7 3.8 4.6 4.1   < > 3.9 4.1 3.8 3.6 2.4*  CL 77* 77* 80* 81*   < > 82* 81* 84* 88* 103  CO2 22 23 23 23    < > 26 24 22 22  19*  GLUCOSE 94 82 73 71   < > 84 82 110* 98 106*  BUN 12 12 12 11    < > 11 10 9 7  <5*  CREATININE 1.15 1.12 1.17 1.06   < > 1.18 1.00 0.93 0.87 0.55*  CALCIUM  8.5* 8.3* 9.0 8.7*   < > 8.7* 8.8* 8.5* 8.3* 5.9*  PHOS 2.2* 2.2* 2.7 2.7  --   --   --   --  2.2*  --    < > = values in this interval not displayed.   GFR: Estimated Creatinine Clearance: 65.2 mL/min (A) (by C-G formula based on SCr of 0.55 mg/dL (L)). Liver Function Tests: Recent Labs  Lab 03/16/17 2236  03/17/17 1141 03/17/17 1334 03/17/17 1536 03/18/17 0456 03/19/17 0447  AST 93*  --   --   --   --  78*  --   ALT 19  --   --   --   --  21  --   ALKPHOS 73  --   --   --   --  60  --   BILITOT 1.5*  --   --   --   --  1.0  --   PROT 7.3  --   --   --   --  7.0  --   ALBUMIN 3.4*   < > 3.0* 3.4* 3.1* 3.1* 2.7*   < > = values in this interval not displayed.   No results for input(s): LIPASE, AMYLASE in the last 168 hours. No results for input(s): AMMONIA in the last 168 hours. Coagulation Profile: No results for input(s): INR, PROTIME in the last 168 hours. Cardiac Enzymes: Recent Labs  Lab 03/16/17 2236 03/17/17 0234 03/17/17 0625 03/17/17 1334  CKTOTAL  --   --   --  1,193*  TROPONINI 0.11* 0.08* 0.06* 0.05*   BNP (last 3 results) No results for input(s): PROBNP in  the last 8760 hours. HbA1C: No results for input(s): HGBA1C in the last 72 hours. CBG: Recent Labs  Lab 03/19/17 1645 03/19/17 2003 03/19/17 2341 03/20/17 0519 03/20/17 0736  GLUCAP 92 120* 101* 91 100*   Lipid Profile: No results for input(s): CHOL, HDL, LDLCALC, TRIG, CHOLHDL, LDLDIRECT in the last 72 hours. Thyroid Function Tests: No results for input(s): TSH, T4TOTAL, FREET4, T3FREE, THYROIDAB in the last 72 hours. Anemia Panel: No results for input(s): VITAMINB12, FOLATE, FERRITIN, TIBC, IRON, RETICCTPCT in the last 72 hours. Sepsis Labs: Recent Labs  Lab 03/17/17 0019  LATICACIDVEN 1.84    No results found for this or any previous visit (from the past 240 hour(s)).     Radiology Studies: Dg Chest 1 View  Result Date: 03/19/2017 CLINICAL DATA:  Recheck PICC line EXAM: CHEST 1 VIEW COMPARISON:  03/18/2017 FINDINGS: Right arm PICC line is again identified and stable at the cavoatrial junction. The cardiac shadow is within normal limits. No focal infiltrate is seen. No bony abnormality is noted. IMPRESSION: PICC line in satisfactory position Electronically Signed   By: Inez Catalina M.D.   On: 03/19/2017 16:00   Dg Chest 1 View  Result Date: 03/18/2017 CLINICAL DATA:  PICC line placement. EXAM: CHEST 1 VIEW COMPARISON:  03/16/2017 and prior radiograph FINDINGS: A right PICC line is identified with tip overlying the lower SVC. The cardiomediastinal silhouette is unchanged. There is no evidence of focal airspace disease, pulmonary edema, suspicious pulmonary nodule/mass, pleural effusion, or pneumothorax. No acute bony abnormalities are identified. IMPRESSION: Right PICC line with tip overlying the lower SVC. Electronically Signed   By: Margarette Canada M.D.   On: 03/18/2017 18:06    Scheduled Meds: . calcium carbonate  1,000 mg of elemental calcium Oral BID WC  . chlorhexidine  15 mL Mouth Rinse BID  . folic  acid  1 mg Oral Daily  . mouth rinse  15 mL Mouth Rinse q12n4p  .  megestrol  400 mg Oral Daily  . multivitamin with minerals  1 tablet Oral Daily  . nicotine  21 mg Transdermal Daily  . potassium chloride  40 mEq Oral BID  . thiamine  100 mg Oral Daily   Or  . thiamine  100 mg Intravenous Daily   Continuous Infusions: . potassium chloride       LOS: 3 days    Time spent: >25 minutes   Deatra James, MD [facility] Pager [phone]  If 7PM-7AM, please contact night-coverage [email or web address] Password [email or web address] 03/20/2017, 11:18 AM

## 2017-03-20 NOTE — Clinical Social Work Note (Signed)
Clinical Social Work Assessment  Patient Details  Name: Anthony Burton MRN: [id number] Date of Birth: 06-10-1940  Date of referral:  03/20/17               Reason for consult:  Facility Placement                Permission sought to share information with:  Chartered certified accountant granted to share information::  Yes, Verbal Permission Granted  Name::        Agency::  [facility]  Relationship::     Contact Information:     Housing/Transportation Living arrangements for the past 2 months:  Apartment Source of Information:  Patient, Siblings Patient Interpreter Needed:  None Criminal Activity/Legal Involvement Pertinent to Current Situation/Hospitalization:  No - Comment as needed Significant Relationships:  Siblings Lives with:  Self Do you feel safe going back to the place where you live?  Yes Need for family participation in patient care:  Yes (Comment)  Care giving concerns: Pt lives alone and cannot safely return home at this time.   Social Worker assessment / plan: Pt is a 77 year old male referred to CSW for SNF placement. Met with pt this AM. Pt is agreeable. He asks LCSW to contact his sister [place] to discuss options. Called [place] and she requested referrals to [facility] and [facility]. Will initiate referrals shortly. Family to look at ALFs for pt to consider after SNF.  Employment status:  Retired Forensic scientist:  Medicare PT Recommendations:  [place] / Referral to community resources:     Patient/Family's Response to care: Pt and family accepting of care.  Patient/Family's Understanding of and Emotional Response to Diagnosis, Current Treatment, and Prognosis: Family appear to understand pt's diagnosis, treatment, and prognosis. Pt has less insight into the issues. No emotional distress identified.  Emotional Assessment Appearance:  Appears older than stated age Attitude/Demeanor/Rapport:  Engaged Affect (typically  observed):  Accepting, Pleasant Orientation:  Oriented to Self, Oriented to Place Alcohol / Substance use:  Alcohol Use Psych involvement (Current and /or in the community):  No (Comment)  Discharge Needs  Concerns to be addressed:  Discharge Planning Concerns, Substance Abuse Concerns Readmission within the last 30 days:  No Current discharge risk:  Lives alone, Physical Impairment, Substance Abuse Barriers to Discharge:  No Barriers Identified   Shade Flood, LCSW 03/20/2017, 10:30 AM

## 2017-03-20 NOTE — Progress Notes (Signed)
CRITICAL VALUE ALERT  Critical Value:  K 2.4 Ca 5.9  Date & Time Notied:  03/20/17 0730  Provider Notified: Dr. Jaci Carrel  Orders Received/Actions taken: awaiting call back/orders

## 2017-03-20 NOTE — Progress Notes (Addendum)
Daily Progress Note   Patient Name: Anthony Burton       Date: 03/20/2017 DOB: November 10, 1940  Age: 77 y.o. MRN#: [id number] Attending Physician: Deatra James, MD Primary Care Physician: Rosita Fire, MD Admit Date: 03/16/2017  Reason for Consultation/Follow-up: Establishing goals of care  Subjective: Met with patient who is feeling better, eating better and has no complaints.  His family has not told him he is going to a SNF rather than home.  Met outside the room with his sister, [place] regarding MOST form.  We had a detailed conversation yesterday during which [place] appeared to be leaning heavily towards DNR, no feeding tube and eventually hospice when appropriate.   Today she had a change of heart.  Against medical advice she completed the MOST form with the following choices:  Full Code Full scope treatment +antibiotics +IV fluids +feeding tube  This change was very surprising to me but [place] felt strongly that this was the right decision.  Assessment:  61 yom with bilateral hematomas, dementia, and alcoholism.  Now more alert, eating better, hyponatremia improved.  Patient Profile/HPI:  77 y.o.malewith past medical history of gout, alcoholism and recurrent fallswho was admitted on 2/3/2019with a sodium level of 110 and an elevated troponin. Per report he has seized this admission due to hyponatremia.   Length of Stay: 3  Current Medications: Scheduled Meds:  . calcium carbonate  1,000 mg of elemental calcium Oral BID WC  . chlorhexidine  15 mL Mouth Rinse BID  . ciprofloxacin  500 mg Oral BID  . folic acid  1 mg Oral Daily  . mouth rinse  15 mL Mouth Rinse q12n4p  . megestrol  400 mg Oral Daily  . multivitamin with minerals  1 tablet Oral Daily  . nicotine  21  mg Transdermal Daily  . potassium chloride  40 mEq Oral BID  . thiamine  100 mg Oral Daily    Continuous Infusions:   PRN Meds: acetaminophen **OR** acetaminophen  Physical Exam       Frail elderly male with poor dentition, cachexia More alert today yet still confused, response time is somewhat slow CV rrr resp no distress Abdomen thin, NT, ND  Vital Signs: BP 131/78 (BP Location: Right Wrist)   Pulse 88   Temp 98.2 F (36.8 C) (Oral)  Resp 16   Ht _0  (1.702 m)   Wt 56.9 kg (125 lb 7.1 oz)   SpO2 100%   BMI 19.65 kg/m  SpO2: SpO2: 100 % O2 Device: O2 Device: Not Delivered O2 Flow Rate:    Intake/output summary:   Intake/Output Summary (Last 24 hours) at 03/20/2017 1456 Last data filed at 03/20/2017 1281 Gross per 24 hour  Intake 1430 ml  Output 700 ml  Net 730 ml   LBM: Last BM Date: 03/19/17 Baseline Weight: Weight: 57 kg (125 lb 10.6 oz) Most recent weight: Weight: 56.9 kg (125 lb 7.1 oz)      PPS score: 30% Palliative Assessment/Data:    Flowsheet Rows     Most Recent Value  Intake Tab  Referral Department  Hospitalist  Unit at Time of Referral  Med/Surg Unit  Palliative Care Primary Diagnosis  -- [ Wernicke's dementia, severe hyponatremia]  Date Notified  03/18/17  Palliative Care Type  New Palliative care  Reason for referral  Clarify Goals of Care, Advance Care Planning  Date of Admission  03/16/17  Date first seen by Palliative Care  03/18/17  # of days Palliative referral response time  0 Day(s)  # of days IP prior to Palliative referral  2  Clinical Assessment  Psychosocial & Spiritual Assessment  Palliative Care Outcomes      Patient Active Problem List   Diagnosis Date Noted  . Malnutrition of moderate degree 03/19/2017  . Palliative care encounter   . Gout 03/17/2017  . Alcohol abuse 03/17/2017  . Falls 03/17/2017  . Weakness 03/17/2017  . Hyponatremia 03/17/2017  . Elevated troponin 03/17/2017  . Bilateral subdural hematomas  ([place]) 03/17/2017  . Maxillary sinusitis 03/17/2017    Palliative Care Plan    Recommendations/Plan:  D/C to SNF when ready.  Please re-consult PMT on next admission.  If there are Palliative services available outpatient at his facility please order them  Goals of Care and Additional Recommendations:  Limitations on Scope of Treatment: Full Scope Treatment  Code Status:  Full code  Prognosis:   Unable to determine   Discharge Planning:  [place] for rehab with Palliative care service follow-up  Care plan was discussed with family.  Thank you for allowing the Palliative Medicine Team to assist in the care of this patient.  Total time spent:  35 min.     Greater than 50%  of this time was spent counseling and coordinating care related to the above assessment and plan.  Florentina Jenny, PA-C Palliative Medicine  Please contact Palliative MedicineTeam phone at [phone] for questions and concerns between 7 am - 7 pm.   Please see AMION for individual provider pager numbers.     '

## 2017-03-20 NOTE — NC FL2 (Signed)
[place] LEVEL OF CARE SCREENING TOOL     IDENTIFICATION  Patient Name: Anthony Burton Birthdate: [birth year] Sex: male Admission Date (Current Location): 03/16/2017  [place] and [place] Number:  [facility] [phone] [place] and Address:  [place] [address] [place]      Provider Number: [id number]  Attending Physician Name and Address:  Deatra James, MD  Relative Name and Phone Number:  [place] (sister) [phone]    Current Level of Care: Hospital Recommended Level of Care: [place] Prior Approval Number:    Date Approved/Denied:   PASRR Number: pending  Discharge Plan: SNF    Current Diagnoses: Patient Active Problem List   Diagnosis Date Noted  . Malnutrition of moderate degree 03/19/2017  . Palliative care encounter   . Gout 03/17/2017  . Alcohol abuse 03/17/2017  . Falls 03/17/2017  . Weakness 03/17/2017  . Hyponatremia 03/17/2017  . Elevated troponin 03/17/2017  . Bilateral subdural hematomas ([place]) 03/17/2017  . Maxillary sinusitis 03/17/2017    Orientation RESPIRATION BLADDER Height & Weight     Self, Place  Normal Continent Weight: 125 lb 7.1 oz (56.9 kg) Height:  5\' 7"  (170.2 cm)  BEHAVIORAL SYMPTOMS/MOOD NEUROLOGICAL BOWEL NUTRITION STATUS      Continent Diet(Regular)  AMBULATORY STATUS COMMUNICATION OF NEEDS Skin   Extensive Assist Verbally Normal                       Personal Care Assistance Level of Assistance  Bathing, Feeding, Dressing Bathing Assistance: Maximum assistance Feeding assistance: Limited assistance Dressing Assistance: Maximum assistance     Functional Limitations Info  Sight, Hearing, Speech Sight Info: Adequate Hearing Info: Adequate Speech Info: Adequate    SPECIAL CARE FACTORS FREQUENCY  PT (By licensed PT)     PT Frequency: 5 times week              Contractures Contractures Info: Not present    Additional Factors  Info  Code Status, Psychotropic Code Status Info: full   Psychotropic Info: librium         Current Medications (03/20/2017):  This is the current hospital active medication list Current Facility-Administered Medications  Medication Dose Route Frequency Provider Last Rate Last Dose  . acetaminophen (TYLENOL) tablet 650 mg  650 mg Oral Q6H PRN Manuella Ghazi, Pratik D, DO       Or  . acetaminophen (TYLENOL) suppository 650 mg  650 mg Rectal Q6H PRN Manuella Ghazi, Pratik D, DO      . chlorhexidine (PERIDEX) 0.12 % solution 15 mL  15 mL Mouth Rinse BID Shahmehdi, Seyed A, MD      . folic acid (FOLVITE) tablet 1 mg  1 mg Oral Daily Manuella Ghazi, Pratik D, DO   1 mg at 03/20/17 0956  . MEDLINE mouth rinse  15 mL Mouth Rinse q12n4p Shahmehdi, Seyed A, MD      . megestrol (MEGACE) 400 MG/10ML suspension 400 mg  400 mg Oral Daily Shahmehdi, Seyed A, MD   400 mg at 03/20/17 0956  . multivitamin with minerals tablet 1 tablet  1 tablet Oral Daily Manuella Ghazi, Pratik D, DO   1 tablet at 03/20/17 0956  . nicotine (NICODERM CQ - dosed in mg/24 hours) patch 21 mg  21 mg Transdermal Daily Schorr, Rhetta Mura, NP   21 mg at 03/20/17 0956  . potassium chloride SA (K-DUR,KLOR-CON) CR tablet 40 mEq  40 mEq Oral Once Deatra James, MD      .  thiamine (VITAMIN B-1) tablet 100 mg  100 mg Oral Daily Manuella Ghazi, Pratik D, DO   100 mg at 03/20/17 9977   Or  . thiamine (B-1) injection 100 mg  100 mg Intravenous Daily Manuella Ghazi, Pratik D, DO         Discharge Medications: Please see discharge summary for a list of discharge medications.  Relevant Imaging Results:  Relevant Lab Results:   Additional Information SSN: [id number] LCSW

## 2017-03-24 DIAGNOSIS — I6203 Nontraumatic chronic subdural hemorrhage: Secondary | ICD-10-CM | POA: Diagnosis not present

## 2017-03-24 DIAGNOSIS — N390 Urinary tract infection, site not specified: Secondary | ICD-10-CM | POA: Diagnosis not present

## 2017-03-24 DIAGNOSIS — M109 Gout, unspecified: Secondary | ICD-10-CM | POA: Diagnosis not present

## 2017-03-24 DIAGNOSIS — E871 Hypo-osmolality and hyponatremia: Secondary | ICD-10-CM | POA: Diagnosis not present

## 2017-03-25 DIAGNOSIS — J0100 Acute maxillary sinusitis, unspecified: Secondary | ICD-10-CM | POA: Diagnosis not present

## 2017-03-25 DIAGNOSIS — E870 Hyperosmolality and hypernatremia: Secondary | ICD-10-CM | POA: Diagnosis not present

## 2017-03-25 DIAGNOSIS — R2681 Unsteadiness on feet: Secondary | ICD-10-CM | POA: Diagnosis not present

## 2017-03-25 DIAGNOSIS — N390 Urinary tract infection, site not specified: Secondary | ICD-10-CM | POA: Diagnosis not present

## 2017-03-31 DIAGNOSIS — Z87891 Personal history of nicotine dependence: Secondary | ICD-10-CM | POA: Diagnosis not present

## 2017-03-31 DIAGNOSIS — E871 Hypo-osmolality and hyponatremia: Secondary | ICD-10-CM | POA: Diagnosis not present

## 2017-03-31 DIAGNOSIS — E538 Deficiency of other specified B group vitamins: Secondary | ICD-10-CM | POA: Diagnosis not present

## 2017-03-31 DIAGNOSIS — N390 Urinary tract infection, site not specified: Secondary | ICD-10-CM | POA: Diagnosis not present

## 2017-04-14 ENCOUNTER — Ambulatory Visit (INDEPENDENT_AMBULATORY_CARE_PROVIDER_SITE_OTHER): Payer: Medicare Other | Admitting: Otolaryngology

## 2017-04-21 DIAGNOSIS — E440 Moderate protein-calorie malnutrition: Secondary | ICD-10-CM | POA: Diagnosis not present

## 2017-04-21 DIAGNOSIS — E538 Deficiency of other specified B group vitamins: Secondary | ICD-10-CM | POA: Diagnosis not present

## 2017-04-21 DIAGNOSIS — Z9181 History of falling: Secondary | ICD-10-CM | POA: Diagnosis not present

## 2017-04-28 ENCOUNTER — Ambulatory Visit (INDEPENDENT_AMBULATORY_CARE_PROVIDER_SITE_OTHER): Payer: Medicare Other | Admitting: Otolaryngology

## 2017-05-07 DIAGNOSIS — E441 Mild protein-calorie malnutrition: Secondary | ICD-10-CM | POA: Diagnosis not present

## 2017-05-07 DIAGNOSIS — Z9181 History of falling: Secondary | ICD-10-CM | POA: Diagnosis not present

## 2017-05-07 DIAGNOSIS — R000 Tachycardia, unspecified: Secondary | ICD-10-CM | POA: Diagnosis not present

## 2017-05-07 DIAGNOSIS — I10 Essential (primary) hypertension: Secondary | ICD-10-CM | POA: Diagnosis not present

## 2017-05-08 DIAGNOSIS — F17290 Nicotine dependence, other tobacco product, uncomplicated: Secondary | ICD-10-CM | POA: Diagnosis not present

## 2017-06-07 DIAGNOSIS — R000 Tachycardia, unspecified: Secondary | ICD-10-CM | POA: Diagnosis not present

## 2017-06-07 DIAGNOSIS — I10 Essential (primary) hypertension: Secondary | ICD-10-CM | POA: Diagnosis not present

## 2017-06-07 DIAGNOSIS — M109 Gout, unspecified: Secondary | ICD-10-CM | POA: Diagnosis not present

## 2017-06-07 DIAGNOSIS — J449 Chronic obstructive pulmonary disease, unspecified: Secondary | ICD-10-CM | POA: Diagnosis not present

## 2017-06-07 DIAGNOSIS — Z720 Tobacco use: Secondary | ICD-10-CM | POA: Diagnosis not present

## 2017-06-07 DIAGNOSIS — R0602 Shortness of breath: Secondary | ICD-10-CM | POA: Diagnosis not present

## 2017-07-02 DIAGNOSIS — M109 Gout, unspecified: Secondary | ICD-10-CM | POA: Diagnosis not present

## 2017-07-02 DIAGNOSIS — R000 Tachycardia, unspecified: Secondary | ICD-10-CM | POA: Diagnosis not present

## 2017-07-02 DIAGNOSIS — E441 Mild protein-calorie malnutrition: Secondary | ICD-10-CM | POA: Diagnosis not present

## 2017-07-02 DIAGNOSIS — I10 Essential (primary) hypertension: Secondary | ICD-10-CM | POA: Diagnosis not present

## 2017-07-31 DIAGNOSIS — M79675 Pain in left toe(s): Secondary | ICD-10-CM | POA: Diagnosis not present

## 2017-07-31 DIAGNOSIS — I739 Peripheral vascular disease, unspecified: Secondary | ICD-10-CM | POA: Diagnosis not present

## 2017-07-31 DIAGNOSIS — B351 Tinea unguium: Secondary | ICD-10-CM | POA: Diagnosis not present

## 2017-07-31 DIAGNOSIS — M2012 Hallux valgus (acquired), left foot: Secondary | ICD-10-CM | POA: Diagnosis not present

## 2017-08-09 DIAGNOSIS — R000 Tachycardia, unspecified: Secondary | ICD-10-CM | POA: Diagnosis not present

## 2017-08-09 DIAGNOSIS — Z9181 History of falling: Secondary | ICD-10-CM | POA: Diagnosis not present

## 2017-08-09 DIAGNOSIS — M109 Gout, unspecified: Secondary | ICD-10-CM | POA: Diagnosis not present

## 2017-08-09 DIAGNOSIS — I10 Essential (primary) hypertension: Secondary | ICD-10-CM | POA: Diagnosis not present

## 2017-09-09 DIAGNOSIS — R000 Tachycardia, unspecified: Secondary | ICD-10-CM | POA: Diagnosis not present

## 2017-09-09 DIAGNOSIS — I10 Essential (primary) hypertension: Secondary | ICD-10-CM | POA: Diagnosis not present

## 2017-09-09 DIAGNOSIS — Z720 Tobacco use: Secondary | ICD-10-CM | POA: Diagnosis not present

## 2017-09-09 DIAGNOSIS — M109 Gout, unspecified: Secondary | ICD-10-CM | POA: Diagnosis not present

## 2017-09-24 DIAGNOSIS — H2513 Age-related nuclear cataract, bilateral: Secondary | ICD-10-CM | POA: Diagnosis not present

## 2017-09-24 DIAGNOSIS — H25043 Posterior subcapsular polar age-related cataract, bilateral: Secondary | ICD-10-CM | POA: Diagnosis not present

## 2017-09-24 DIAGNOSIS — H25013 Cortical age-related cataract, bilateral: Secondary | ICD-10-CM | POA: Diagnosis not present

## 2017-10-07 DIAGNOSIS — I10 Essential (primary) hypertension: Secondary | ICD-10-CM | POA: Diagnosis not present

## 2017-10-07 DIAGNOSIS — M109 Gout, unspecified: Secondary | ICD-10-CM | POA: Diagnosis not present

## 2017-10-07 DIAGNOSIS — R270 Ataxia, unspecified: Secondary | ICD-10-CM | POA: Diagnosis not present

## 2017-10-07 DIAGNOSIS — Z720 Tobacco use: Secondary | ICD-10-CM | POA: Diagnosis not present

## 2017-10-28 DIAGNOSIS — H25812 Combined forms of age-related cataract, left eye: Secondary | ICD-10-CM | POA: Diagnosis not present

## 2017-10-28 DIAGNOSIS — Z01818 Encounter for other preprocedural examination: Secondary | ICD-10-CM | POA: Diagnosis not present

## 2017-10-28 DIAGNOSIS — H25813 Combined forms of age-related cataract, bilateral: Secondary | ICD-10-CM | POA: Diagnosis not present

## 2017-11-04 DIAGNOSIS — Z720 Tobacco use: Secondary | ICD-10-CM | POA: Diagnosis not present

## 2017-11-04 DIAGNOSIS — I10 Essential (primary) hypertension: Secondary | ICD-10-CM | POA: Diagnosis not present

## 2017-11-04 DIAGNOSIS — R000 Tachycardia, unspecified: Secondary | ICD-10-CM | POA: Diagnosis not present

## 2017-11-04 DIAGNOSIS — M109 Gout, unspecified: Secondary | ICD-10-CM | POA: Diagnosis not present

## 2017-11-10 DIAGNOSIS — H25812 Combined forms of age-related cataract, left eye: Secondary | ICD-10-CM | POA: Diagnosis not present

## 2017-11-10 DIAGNOSIS — H2512 Age-related nuclear cataract, left eye: Secondary | ICD-10-CM | POA: Diagnosis not present

## 2017-11-26 DIAGNOSIS — H25811 Combined forms of age-related cataract, right eye: Secondary | ICD-10-CM | POA: Diagnosis not present

## 2017-11-26 DIAGNOSIS — H2511 Age-related nuclear cataract, right eye: Secondary | ICD-10-CM | POA: Diagnosis not present

## 2017-12-03 DIAGNOSIS — M79675 Pain in left toe(s): Secondary | ICD-10-CM | POA: Diagnosis not present

## 2017-12-03 DIAGNOSIS — B351 Tinea unguium: Secondary | ICD-10-CM | POA: Diagnosis not present

## 2017-12-03 DIAGNOSIS — M2012 Hallux valgus (acquired), left foot: Secondary | ICD-10-CM | POA: Diagnosis not present

## 2017-12-03 DIAGNOSIS — I739 Peripheral vascular disease, unspecified: Secondary | ICD-10-CM | POA: Diagnosis not present

## 2017-12-04 DIAGNOSIS — Z720 Tobacco use: Secondary | ICD-10-CM | POA: Diagnosis not present

## 2017-12-04 DIAGNOSIS — Z9181 History of falling: Secondary | ICD-10-CM | POA: Diagnosis not present

## 2017-12-04 DIAGNOSIS — M109 Gout, unspecified: Secondary | ICD-10-CM | POA: Diagnosis not present

## 2017-12-04 DIAGNOSIS — I10 Essential (primary) hypertension: Secondary | ICD-10-CM | POA: Diagnosis not present

## 2017-12-28 DIAGNOSIS — R4702 Dysphasia: Secondary | ICD-10-CM | POA: Diagnosis not present

## 2017-12-28 DIAGNOSIS — M109 Gout, unspecified: Secondary | ICD-10-CM | POA: Diagnosis not present

## 2017-12-28 DIAGNOSIS — Z9181 History of falling: Secondary | ICD-10-CM | POA: Diagnosis not present

## 2017-12-28 DIAGNOSIS — I10 Essential (primary) hypertension: Secondary | ICD-10-CM | POA: Diagnosis not present

## 2018-01-21 DIAGNOSIS — I10 Essential (primary) hypertension: Secondary | ICD-10-CM | POA: Diagnosis not present

## 2019-07-03 ENCOUNTER — Inpatient Hospital Stay (HOSPITAL_COMMUNITY)
Admission: EM | Admit: 2019-07-03 | Discharge: 2019-07-05 | DRG: 683 | Disposition: A | Discharge status: Home Health | Payer: Medicare Other | Source: Skilled Nursing Facility | Attending: Internal Medicine | Admitting: Internal Medicine

## 2019-07-03 ENCOUNTER — Emergency Department (HOSPITAL_COMMUNITY): Payer: Medicare Other

## 2019-07-03 ENCOUNTER — Other Ambulatory Visit: Payer: Self-pay

## 2019-07-03 DIAGNOSIS — N179 Acute kidney failure, unspecified: Secondary | ICD-10-CM | POA: Diagnosis not present

## 2019-07-03 DIAGNOSIS — E869 Volume depletion, unspecified: Secondary | ICD-10-CM | POA: Diagnosis present

## 2019-07-03 DIAGNOSIS — Z79899 Other long term (current) drug therapy: Secondary | ICD-10-CM

## 2019-07-03 DIAGNOSIS — F17210 Nicotine dependence, cigarettes, uncomplicated: Secondary | ICD-10-CM | POA: Diagnosis present

## 2019-07-03 DIAGNOSIS — Z7982 Long term (current) use of aspirin: Secondary | ICD-10-CM

## 2019-07-03 DIAGNOSIS — R569 Unspecified convulsions: Secondary | ICD-10-CM

## 2019-07-03 DIAGNOSIS — Z20822 Contact with and (suspected) exposure to covid-19: Secondary | ICD-10-CM | POA: Diagnosis present

## 2019-07-03 DIAGNOSIS — R4182 Altered mental status, unspecified: Secondary | ICD-10-CM | POA: Diagnosis not present

## 2019-07-03 DIAGNOSIS — F1010 Alcohol abuse, uncomplicated: Secondary | ICD-10-CM | POA: Diagnosis present

## 2019-07-03 DIAGNOSIS — E871 Hypo-osmolality and hyponatremia: Secondary | ICD-10-CM | POA: Diagnosis present

## 2019-07-03 DIAGNOSIS — I10 Essential (primary) hypertension: Secondary | ICD-10-CM | POA: Diagnosis present

## 2019-07-03 LAB — MAGNESIUM: Magnesium: 1.7 mg/dL (ref 1.7–2.4)

## 2019-07-03 LAB — RAPID URINE DRUG SCREEN, HOSP PERFORMED
Amphetamines: NOT DETECTED
Barbiturates: NOT DETECTED
Benzodiazepines: NOT DETECTED
Cocaine: NOT DETECTED
Opiates: NOT DETECTED
Tetrahydrocannabinol: NOT DETECTED

## 2019-07-03 LAB — COMPREHENSIVE METABOLIC PANEL
ALT: 11 U/L (ref 0–44)
AST: 18 U/L (ref 15–41)
Albumin: 3.9 g/dL (ref 3.5–5.0)
Alkaline Phosphatase: 60 U/L (ref 38–126)
Anion gap: 8 (ref 5–15)
BUN: 22 mg/dL (ref 8–23)
CO2: 25 mmol/L (ref 22–32)
Calcium: 9.4 mg/dL (ref 8.9–10.3)
Chloride: 91 mmol/L — ABNORMAL LOW (ref 98–111)
Creatinine, Ser: 1.65 mg/dL — ABNORMAL HIGH (ref 0.61–1.24)
GFR calc Af Amer: 46 mL/min — ABNORMAL LOW (ref >60)
GFR calc non Af Amer: 39 mL/min — ABNORMAL LOW (ref >60)
Glucose, Bld: 103 mg/dL — ABNORMAL HIGH (ref 70–99)
Potassium: 4.4 mmol/L (ref 3.5–5.1)
Sodium: 124 mmol/L — ABNORMAL LOW (ref 135–145)
Total Bilirubin: 1.1 mg/dL (ref 0.3–1.2)
Total Protein: 8 g/dL (ref 6.5–8.1)

## 2019-07-03 LAB — CBC WITH DIFFERENTIAL/PLATELET
Abs Immature Granulocytes: 0.05 10*3/uL (ref 0.00–0.07)
Basophils Absolute: 0.1 10*3/uL (ref 0.0–0.1)
Basophils Relative: 1 %
Eosinophils Absolute: 0.4 10*3/uL (ref 0.0–0.5)
Eosinophils Relative: 5 %
HCT: 36.8 % — ABNORMAL LOW (ref 39.0–52.0)
Hemoglobin: 12 g/dL — ABNORMAL LOW (ref 13.0–17.0)
Immature Granulocytes: 1 %
Lymphocytes Relative: 27 %
Lymphs Abs: 2 10*3/uL (ref 0.7–4.0)
MCH: 26.5 pg (ref 26.0–34.0)
MCHC: 32.6 g/dL (ref 30.0–36.0)
MCV: 81.4 fL (ref 80.0–100.0)
Monocytes Absolute: 0.7 10*3/uL (ref 0.1–1.0)
Monocytes Relative: 9 %
Neutro Abs: 4.2 10*3/uL (ref 1.7–7.7)
Neutrophils Relative %: 57 %
Platelets: 237 10*3/uL (ref 150–400)
RBC: 4.52 MIL/uL (ref 4.22–5.81)
RDW: 15.5 % (ref 11.5–15.5)
WBC: 7.4 10*3/uL (ref 4.0–10.5)
nRBC: 0 % (ref 0.0–0.2)

## 2019-07-03 LAB — URINALYSIS, ROUTINE W REFLEX MICROSCOPIC
Bacteria, UA: NONE SEEN
Bilirubin Urine: NEGATIVE
Glucose, UA: NEGATIVE mg/dL
Ketones, ur: NEGATIVE mg/dL
Nitrite: NEGATIVE
Protein, ur: NEGATIVE mg/dL
Specific Gravity, Urine: 1.008 (ref 1.005–1.030)
pH: 6 (ref 5.0–8.0)

## 2019-07-03 LAB — ETHANOL: Alcohol, Ethyl (B): <10 mg/dL (ref <10)

## 2019-07-03 LAB — SARS CORONAVIRUS 2 BY RT PCR (HOSPITAL ORDER, PERFORMED IN ~~LOC~~ HOSPITAL LAB): SARS Coronavirus 2: NEGATIVE

## 2019-07-03 MED ORDER — SODIUM CHLORIDE 0.9 % IV BOLUS
1000.0000 mL | Freq: Once | INTRAVENOUS | Status: AC
  Administered 2019-07-03: 1000 mL via INTRAVENOUS

## 2019-07-03 NOTE — ED Provider Notes (Signed)
[facility] EMERGENCY DEPARTMENT Provider Note   CSN: [id number] Arrival date & time: 07/03/19  1956     History Chief Complaint  Patient presents with  . Seizures    Anthony Burton is a 79 y.o. male with past medical history significant for alcohol abuse, weakness, hyponatremia, bilateral subdural hematomas presents to emergency department today via EMS with chief complaint of seizure-like activity.  Patient was playing bingo at [facility] where he is a resident when his eyes rolled back in his head and his body was stiff and twitching.  The episode lasted approximately 30 seconds.  EMS was called and they state patient was back to baseline on their arrival.  He said no further episodes of seizure activity in their care.  Patient is denying any physical complaints to me.  He denies biting his tongue, urinary incontinence.  Denies any recent illness, fever, chills, chest pain, headache, neck pain point visual changes, abdominal pain, nausea, vomiting, diarrhea.  He is not anticoagulated.  Patient states he has not drank alcohol in the last 2 weeks years.  He denies seizure history.   Past Medical History:  Diagnosis Date  . Gout     Patient Active Problem List   Diagnosis Date Noted  . Malnutrition of moderate degree 03/19/2017  . Palliative care encounter   . Gout 03/17/2017  . Alcohol abuse 03/17/2017  . Falls 03/17/2017  . Weakness 03/17/2017  . Hyponatremia 03/17/2017  . Elevated troponin 03/17/2017  . Bilateral subdural hematomas ([place]) 03/17/2017  . Maxillary sinusitis 03/17/2017    No past surgical history on file.     No family history on file.  Social History   Tobacco Use  . Smoking status: Current Every Day Smoker    Types: Cigarettes  . Smokeless tobacco: Never Used  Substance Use Topics  . Alcohol use: Yes    Comment: daily use  . Drug use: No    Home Medications Prior to Admission medications   Medication Sig Start Date End Date Taking?  Authorizing Provider  aspirin 325 MG tablet Take 650 mg by mouth daily.    [provider]  calcium carbonate (OS-CAL - DOSED IN MG OF ELEMENTAL CALCIUM) 1250 (500 Ca) MG tablet Take 1 tablet (500 mg of elemental calcium total) by mouth 2 (two) times daily with a meal. 03/20/17   Shahmehdi, Valeria Batman, MD  chlordiazePOXIDE (LIBRIUM) 25 MG capsule 1 po every 8 hours for anxiety 03/16/15   Milton Ferguson, MD  chlorhexidine (PERIDEX) 0.12 % solution 15 mLs by Mouth Rinse route 2 (two) times daily. 03/20/17   Shahmehdi, Valeria Batman, MD  folic acid (FOLVITE) 1 MG tablet Take 1 tablet (1 mg total) by mouth daily. 03/21/17   Shahmehdi, Valeria Batman, MD  megestrol (MEGACE) 40 MG/ML suspension Take 10 mLs (400 mg total) by mouth daily. 03/20/17   ShahmehdiValeria Batman, MD  Multiple Vitamin (MULTIVITAMIN WITH MINERALS) TABS tablet Take 1 tablet by mouth daily. 03/21/17   Shahmehdi, Valeria Batman, MD  nicotine (NICODERM CQ - DOSED IN MG/24 HOURS) 21 mg/24hr patch Place 1 patch (21 mg total) onto the skin daily. 03/21/17   Shahmehdi, Valeria Batman, MD  potassium chloride SA (K-DUR,KLOR-CON) 20 MEQ tablet Take 2 tablets (40 mEq total) by mouth once for 1 dose. 03/20/17 03/20/17  ShahmehdiValeria Batman, MD  thiamine 100 MG tablet Take 1 tablet (100 mg total) by mouth daily. 03/21/17   Deatra James, MD    Allergies  Patient has no known allergies.  Review of Systems   Review of Systems All other systems are reviewed and are negative for acute change except as noted in the HPI.  Physical Exam Updated Vital Signs BP 131/71 (BP Location: Left Arm)   Pulse 71   Temp 98.9 F (37.2 C) (Oral)   Resp 18   Ht 5\' 9"  (1.753 m)   Wt 77.1 kg   SpO2 99%   BMI 25.10 kg/m   Physical Exam Vitals and nursing note reviewed.  Constitutional:      General: He is not in acute distress.    Appearance: He is not ill-appearing.  HENT:     Head: Normocephalic and atraumatic.     Right Ear: Tympanic membrane and external ear normal.     Left Ear:  Tympanic membrane and external ear normal.     Nose: Nose normal.     Mouth/Throat:     Mouth: Mucous membranes are moist.     Pharynx: Oropharynx is clear.     Comments: No evidence of tongue biting, no bleeding seen in mouth. Eyes:     General: No scleral icterus.       Right eye: No discharge.        Left eye: No discharge.     Extraocular Movements: Extraocular movements intact.     Conjunctiva/sclera: Conjunctivae normal.     Pupils: Pupils are equal, round, and reactive to light.  Neck:     Vascular: No JVD.  Cardiovascular:     Rate and Rhythm: Normal rate and regular rhythm.     Pulses: Normal pulses.          Radial pulses are 2+ on the right side and 2+ on the left side.     Heart sounds: Normal heart sounds.  Pulmonary:     Comments: Lungs clear to auscultation in all fields. Symmetric chest rise. No wheezing, rales, or rhonchi. Abdominal:     Comments: Abdomen is soft, non-distended, and non-tender in all quadrants. No rigidity, no guarding. No peritoneal signs.  Musculoskeletal:        General: Normal range of motion.     Cervical back: Normal range of motion.  Skin:    General: Skin is warm and dry.     Capillary Refill: Capillary refill takes less than 2 seconds.  Neurological:     Mental Status: He is oriented to person, place, and time.     GCS: GCS eye subscore is 4. GCS verbal subscore is 5. GCS motor subscore is 6.     Comments: Fluent speech, no facial droop.  Speech is clear and goal oriented, follows commands CN III-XII intact, no facial droop Normal strength in upper and lower extremities bilaterally including dorsiflexion and plantar flexion, strong and equal grip strength Sensation normal to light and sharp touch Moves extremities without ataxia, coordination intact Normal finger to nose and rapid alternating movements Normal gait and balance   Psychiatric:        Behavior: Behavior normal.     ED Results / Procedures / Treatments    Labs (all labs ordered are listed, but only abnormal results are displayed) Labs Reviewed  COMPREHENSIVE METABOLIC PANEL - Abnormal; Notable for the following components:      Result Value   Sodium 124 (*)    Chloride 91 (*)    Glucose, Bld 103 (*)    Creatinine, Ser 1.65 (*)    GFR calc non Af Amer 39 (*)  GFR calc Af Amer 46 (*)    All other components within normal limits  CBC WITH DIFFERENTIAL/PLATELET - Abnormal; Notable for the following components:   Hemoglobin 12.0 (*)    HCT 36.8 (*)    All other components within normal limits  ETHANOL  MAGNESIUM  RAPID URINE DRUG SCREEN, HOSP PERFORMED  URINALYSIS, ROUTINE W REFLEX MICROSCOPIC  CBG MONITORING, ED    EKG EKG Interpretation  Date/Time:  Saturday Jul 03 2019 22:12:34 EDT Ventricular Rate:  77 PR Interval:    QRS Duration: 87 QT Interval:  396 QTC Calculation: 449 R Axis:   74 Text Interpretation: Sinus rhythm Anteroseptal infarct, age indeterminate Artifact in lead(s) I II aVR aVL V1 No old tracing to compare Confirmed by Delora Fuel ([id number]) on 07/04/2019 12:32:05 AM   Radiology No results found.  Procedures Procedures (including critical care time)  Medications Ordered in ED Medications - No data to display  ED Course  I have reviewed the triage vital signs and the nursing notes.  Pertinent labs & imaging results that were available during my care of the patient were reviewed by me and considered in my medical decision making (see chart for details).    MDM Rules/Calculators/A&P                      History provided by patient with additional history obtained from chart review.    79 year old male presenting from assisted living after witnessed seizure-like activity.  Patient is back to baseline on arrival to the ED.  CBG on arrival is 97.  Patient presents awake, alert, hemodynamically stable, afebrile, non toxic.  On exam he has no evidence of tongue biting or urinary incontinence.  Neuro exam is  normal.  He denies any physical complaints.  Work-up shows hyponatremia of 124, creatinine elevated at 1.65.  Previous labs to compare are x2 years ago when creatinine was .55.  It appears his baseline creatinine is 1 based on prior labs.  No transaminitis.  Ethanol is negative.  UDS is negative.  UA without signs of infection.  EKG without ischemic changes.  CT head pending.   Patient care transferred to T. Triplett PA-C at the end of my shift pending head CT. Patient presentation, ED course, and plan of care discussed with review of all pertinent labs and imaging. Please see her note for further details regarding further ED course and disposition. anticipate admission for hyponatremia. This case was discussed with Dr. Roderic Palau who agrees with plan to admit.    Portions of this note were generated with [employer]. Dictation errors may occur despite best attempts at proofreading.    Final Clinical Impression(s) / ED Diagnoses Final diagnoses:  Hyponatremia    Rx / DC Orders ED Discharge Orders    None       Cherre Robins, PA-C 07/04/19 [id number]    Milton Ferguson, MD 07/04/19 1535

## 2019-07-03 NOTE — ED Triage Notes (Addendum)
Per EMS, staff states pt was sitting at table when his head fell back, eyes rolled back in head, body was stiff and twitching. Episodes last 30sec x2. EMS states upon arrival pt was A&0 x4 and no recurrent episodes while in their care.

## 2019-07-03 NOTE — ED Provider Notes (Signed)
  Patient signed out to me by Emeterio Reeve, PA-C pending completion of work-up and reassessment.  Patient here with seizure-like activity this evening while playing bingo.  He is a resident of [facility].  No history of prior seizures.  Patient does not appear postictal.  Labs this evening show hyponatremia with sodium of 124.  Sodium was 128 2 years ago.  He does have history of same.  Creatinine also elevated at 1.65.  CT of head is negative for acute finding.  Given patient's new onset seizure and hyponatremia is felt that patient will need to be admitted.   CT Head Wo Contrast  Result Date: 07/03/2019 CLINICAL DATA:  Seizure, nontraumatic EXAM: CT HEAD WITHOUT CONTRAST TECHNIQUE: Contiguous axial images were obtained from the base of the skull through the vertex without intravenous contrast. COMPARISON:  03/17/2017 FINDINGS: Brain: Chronic small vessel ischemic changes are seen scattered throughout the periventricular white matter, stable. No acute infarct or hemorrhage. Lateral ventricles and midline structures are unremarkable. There is diffuse cerebral atrophy, stable. No acute extra-axial fluid collections. No mass effect. Vascular: No hyperdense vessel or unexpected calcification. Skull: Normal. Negative for fracture or focal lesion. 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 No significant change since prior exam. Other: None. IMPRESSION: 1. No acute intracranial process. 2. Chronic small vessel ischemic changes, stable. 3. Continued expansile right maxillary sinus disease, without significant change. Electronically Signed   By: Randa Ngo M.D.   On: 07/03/2019 22:38     Labs Reviewed  COMPREHENSIVE METABOLIC PANEL - Abnormal; Notable for the following components:      Result Value   Sodium 124 (*)    Chloride 91 (*)    Glucose, Bld 103 (*)    Creatinine, Ser 1.65 (*)    GFR calc non Af Amer  39 (*)    GFR calc Af Amer 46 (*)    All other components within normal limits  CBC WITH DIFFERENTIAL/PLATELET - Abnormal; Notable for the following components:   Hemoglobin 12.0 (*)    HCT 36.8 (*)    All other components within normal limits  URINALYSIS, ROUTINE W REFLEX MICROSCOPIC - Abnormal; Notable for the following components:   Hgb urine dipstick MODERATE (*)    Leukocytes,Ua MODERATE (*)    All other components within normal limits  SARS CORONAVIRUS 2 BY RT PCR (HOSPITAL ORDER, [place] [facility])  ETHANOL  MAGNESIUM  RAPID URINE DRUG SCREEN, HOSP PERFORMED  CBG MONITORING, ED    Consulted hospitalist, Dr. Humphrey Rolls and discussed findings.  He agrees to admit patient.   Kem Parkinson, PA-C 07/04/19 Serita Butcher, MD 07/04/19 1534

## 2019-07-04 ENCOUNTER — Observation Stay (HOSPITAL_COMMUNITY): Payer: Medicare Other

## 2019-07-04 DIAGNOSIS — R4182 Altered mental status, unspecified: Secondary | ICD-10-CM | POA: Diagnosis present

## 2019-07-04 DIAGNOSIS — N179 Acute kidney failure, unspecified: Secondary | ICD-10-CM | POA: Diagnosis present

## 2019-07-04 DIAGNOSIS — F17210 Nicotine dependence, cigarettes, uncomplicated: Secondary | ICD-10-CM | POA: Diagnosis present

## 2019-07-04 DIAGNOSIS — E871 Hypo-osmolality and hyponatremia: Secondary | ICD-10-CM | POA: Diagnosis present

## 2019-07-04 DIAGNOSIS — R569 Unspecified convulsions: Secondary | ICD-10-CM | POA: Diagnosis not present

## 2019-07-04 DIAGNOSIS — Z79899 Other long term (current) drug therapy: Secondary | ICD-10-CM | POA: Diagnosis not present

## 2019-07-04 DIAGNOSIS — Z20822 Contact with and (suspected) exposure to covid-19: Secondary | ICD-10-CM | POA: Diagnosis present

## 2019-07-04 DIAGNOSIS — I10 Essential (primary) hypertension: Secondary | ICD-10-CM | POA: Diagnosis present

## 2019-07-04 DIAGNOSIS — Z7982 Long term (current) use of aspirin: Secondary | ICD-10-CM | POA: Diagnosis not present

## 2019-07-04 DIAGNOSIS — E869 Volume depletion, unspecified: Secondary | ICD-10-CM | POA: Diagnosis present

## 2019-07-04 LAB — CBC
HCT: 34.9 % — ABNORMAL LOW (ref 39.0–52.0)
Hemoglobin: 11.6 g/dL — ABNORMAL LOW (ref 13.0–17.0)
MCH: 26.9 pg (ref 26.0–34.0)
MCHC: 33.2 g/dL (ref 30.0–36.0)
MCV: 81 fL (ref 80.0–100.0)
Platelets: 236 10*3/uL (ref 150–400)
RBC: 4.31 MIL/uL (ref 4.22–5.81)
RDW: 15.5 % (ref 11.5–15.5)
WBC: 5.7 10*3/uL (ref 4.0–10.5)
nRBC: 0 % (ref 0.0–0.2)

## 2019-07-04 LAB — BASIC METABOLIC PANEL
Anion gap: 8 (ref 5–15)
Anion gap: 7 (ref 5–15)
BUN: 20 mg/dL (ref 8–23)
BUN: 18 mg/dL (ref 8–23)
CO2: 23 mmol/L (ref 22–32)
CO2: 22 mmol/L (ref 22–32)
Calcium: 9 mg/dL (ref 8.9–10.3)
Calcium: 9.1 mg/dL (ref 8.9–10.3)
Chloride: 95 mmol/L — ABNORMAL LOW (ref 98–111)
Chloride: 99 mmol/L (ref 98–111)
Creatinine, Ser: 1.46 mg/dL — ABNORMAL HIGH (ref 0.61–1.24)
Creatinine, Ser: 1.43 mg/dL — ABNORMAL HIGH (ref 0.61–1.24)
GFR calc Af Amer: 53 mL/min — ABNORMAL LOW (ref >60)
GFR calc Af Amer: 54 mL/min — ABNORMAL LOW (ref >60)
GFR calc non Af Amer: 46 mL/min — ABNORMAL LOW (ref >60)
GFR calc non Af Amer: 47 mL/min — ABNORMAL LOW (ref >60)
Glucose, Bld: 104 mg/dL — ABNORMAL HIGH (ref 70–99)
Glucose, Bld: 96 mg/dL (ref 70–99)
Potassium: 4.7 mmol/L (ref 3.5–5.1)
Potassium: 4.3 mmol/L (ref 3.5–5.1)
Sodium: 126 mmol/L — ABNORMAL LOW (ref 135–145)
Sodium: 128 mmol/L — ABNORMAL LOW (ref 135–145)

## 2019-07-04 LAB — CK: Total CK: 90 U/L (ref 49–397)

## 2019-07-04 MED ORDER — VITAMIN B-12 1000 MCG PO TABS
1000.0000 ug | ORAL_TABLET | Freq: Every day | ORAL | Status: DC
  Administered 2019-07-04 – 2019-07-05 (×2): 1000 ug via ORAL
  Filled 2019-07-04 (×4): qty 1

## 2019-07-04 MED ORDER — HEPARIN SODIUM (PORCINE) 5000 UNIT/ML IJ SOLN
5000.0000 [IU] | Freq: Three times a day (TID) | INTRAMUSCULAR | Status: DC
  Administered 2019-07-04 – 2019-07-05 (×5): 5000 [IU] via SUBCUTANEOUS
  Filled 2019-07-04 (×6): qty 1

## 2019-07-04 MED ORDER — FOLIC ACID 1 MG PO TABS
1.0000 mg | ORAL_TABLET | Freq: Every day | ORAL | Status: DC
  Administered 2019-07-04 – 2019-07-05 (×2): 1 mg via ORAL
  Filled 2019-07-04 (×2): qty 1

## 2019-07-04 MED ORDER — ACETAMINOPHEN 325 MG PO TABS: 650.0000 mg | ORAL_TABLET | Freq: Four times a day (QID) | ORAL | Status: DC | PRN

## 2019-07-04 MED ORDER — MEGESTROL ACETATE 40 MG/ML PO SUSP
400.0000 mg | Freq: Every day | ORAL | Status: DC
  Administered 2019-07-04 – 2019-07-05 (×2): 400 mg via ORAL
  Filled 2019-07-04 (×4): qty 10

## 2019-07-04 MED ORDER — SODIUM CHLORIDE 0.9 % IV SOLN: INTRAVENOUS | Status: AC

## 2019-07-04 MED ORDER — MAGNESIUM SULFATE 2 GM/50ML IV SOLN
2.0000 g | Freq: Once | INTRAVENOUS | Status: AC
  Administered 2019-07-04: 2 g via INTRAVENOUS
  Filled 2019-07-04: qty 50

## 2019-07-04 MED ORDER — ONDANSETRON HCL 4 MG/2ML IJ SOLN: 4.0000 mg | Freq: Four times a day (QID) | INTRAMUSCULAR | Status: DC | PRN

## 2019-07-04 MED ORDER — ONDANSETRON HCL 4 MG PO TABS: 4.0000 mg | ORAL_TABLET | Freq: Four times a day (QID) | ORAL | Status: DC | PRN

## 2019-07-04 MED ORDER — THIAMINE HCL 100 MG PO TABS
100.0000 mg | ORAL_TABLET | Freq: Every day | ORAL | Status: DC
  Administered 2019-07-04 – 2019-07-05 (×2): 100 mg via ORAL
  Filled 2019-07-04 (×2): qty 1

## 2019-07-04 MED ORDER — ACETAMINOPHEN 650 MG RE SUPP: 650.0000 mg | Freq: Four times a day (QID) | RECTAL | Status: DC | PRN

## 2019-07-04 MED ORDER — METOPROLOL TARTRATE 50 MG PO TABS
25.0000 mg | ORAL_TABLET | Freq: Two times a day (BID) | ORAL | Status: DC
  Administered 2019-07-04 – 2019-07-05 (×3): 25 mg via ORAL
  Filled 2019-07-04 (×3): qty 1

## 2019-07-04 NOTE — H&P (Signed)
History and Physical    Anthony Burton WUJ:[id number] DOB: Oct 20, 1940 DOA: 07/03/2019  PCP: Rosita Fire, MD (Confirm with patient/family/NH records and if not entered, this has to be entered at [facility] point of entry) Patient coming from: [facility] facility  I have personally briefly reviewed patient's old medical records in [place]  Chief Complaint: Seizure-like activity  HPI: Anthony Burton is a 79 y.o. male with medical history significant of chronic hyponatremia, alcohol abuse and bilateral subdural hematoma is brought to ED by EMS for evaluation of seizure-like activity.  According to the ED physician, EMS reported that patient was playing bingo and suddenly his head fell back, eyes rolled back and had and body was stiff and twitching.  Episode lasted for 30 seconds.  Patient did not lose control over urination or defecation during the episode and he did not bite his tongue.  On arrival to the ED patient was awake alert and oriented x4.  During my evaluation patient denies any complaints and also denies any history of previous seizures.  Patient denies fever, chills, chest pain, shortness of breath, nausea, vomiting, abdominal pain and urinary symptoms.  ED Course: On arrival to the ED patient had temperature of 98.9, blood pressure 131/71, heart rate 71, respiratory rate 18 and oxygen saturation 99% on room air.  Blood work showed WBC 7.4, hemoglobin 12, sodium 124, potassium 4.4, BUN 22, creatinine 1.65, blood glucose 103.  UDS was negative and alcohol level was also less than 10.  Patient was given a bolus of IV normal saline in the ED.  Review of Systems: As per HPI otherwise 10 point review of systems negative.  Unacceptable ROS statements: "10 systems reviewed," "Extensive" (without elaboration).  Acceptable ROS statements: "All others negative," "All others reviewed and are negative," and "All others unremarkable," with at [place] documented Can't double dip - if using  for HPI can't use for ROS  Past Medical History:  Diagnosis Date  . Gout     No past surgical history on file.   reports that he has been smoking cigarettes. He has never used smokeless tobacco. He reports current alcohol use. He reports that he does not use drugs.  No Known Allergies  No family history on file. Family history reviewed but not pertinent Unacceptable: Noncontributory, unremarkable, or negative. Acceptable: (example)Family history negative for heart disease  Prior to Admission medications   Medication Sig Start Date End Date Taking? Authorizing Provider  Calcium Carbonate-Vitamin D (CALCIUM 600+D) 600-400 MG-UNIT tablet Take 1 tablet by mouth 2 (two) times daily.   Yes [provider]  chlorhexidine (PERIDEX) 0.12 % solution 15 mLs by Mouth Rinse route 2 (two) times daily. 03/20/17  Yes Shahmehdi, Valeria Batman, MD  folic acid (FOLVITE) 1 MG tablet Take 1 tablet (1 mg total) by mouth daily. 03/21/17  Yes Shahmehdi, Valeria Batman, MD  ibuprofen (ADVIL) 800 MG tablet Take 800 mg by mouth every 8 (eight) hours as needed for mild pain or moderate pain.   Yes [provider]  megestrol (MEGACE) 40 MG/ML suspension Take 10 mLs (400 mg total) by mouth daily. 03/20/17  Yes Shahmehdi, Seyed A, MD  metoprolol tartrate (LOPRESSOR) 25 MG tablet Take 25 mg by mouth 2 (two) times daily. 06/15/19  Yes [provider]  Multiple Vitamin (MULTIVITAMIN WITH MINERALS) TABS tablet Take 1 tablet by mouth daily. 03/21/17  Yes Shahmehdi, Seyed A, MD  thiamine 100 MG tablet Take 1 tablet (100 mg total) by mouth  daily. 03/21/17  Yes Shahmehdi, Valeria Batman, MD  vitamin B-12 (CYANOCOBALAMIN) 1000 MCG tablet Take 1,000 mcg by mouth daily.   Yes [provider]    Physical Exam: Vitals:   07/04/19 0330 07/04/19 0430 07/04/19 0500 07/04/19 0530  BP: (!) 135/91 136/87 136/88 (!) 149/89  Pulse: 74 75 78 78  Resp: 17 11 19 17   Temp:      TempSrc:      SpO2: 99% 100% 99% 99%  Weight:        Height:        Constitutional: NAD, calm, comfortable Vitals:   07/04/19 0330 07/04/19 0430 07/04/19 0500 07/04/19 0530  BP: (!) 135/91 136/87 136/88 (!) 149/89  Pulse: 74 75 78 78  Resp: 17 11 19 17   Temp:      TempSrc:      SpO2: 99% 100% 99% 99%  Weight:      Height:        General: 79 year old African-American male in no acute distress. Eyes: PERRL, lids and conjunctivae normal ENMT: Mucous membranes are moist. Posterior pharynx clear of any exudate or lesions.Normal dentition.  Neck: normal, supple, no masses, no thyromegaly Respiratory: clear to auscultation bilaterally, no wheezing, no crackles. Normal respiratory effort. No accessory muscle use.  Cardiovascular: Regular rate and rhythm, no murmurs / rubs / gallops. No extremity edema. 2+ pedal pulses. No carotid bruits.  Abdomen: no tenderness, no masses palpated. No hepatosplenomegaly. Bowel sounds positive.  Musculoskeletal: no clubbing / cyanosis. No joint deformity upper and lower extremities. Good ROM, no contractures. Normal muscle tone.  Skin: no rashes, lesions, ulcers. No induration Neurologic: CN 2-12 grossly intact. Sensation intact, DTR normal. Strength 5/5 in all 4.  Cranial nerves II to XII grossly intact Psychiatric: Normal judgment and insight. Alert and oriented x 3. Normal mood.   (Anything < 9 systems with 2 bullets each down codes to level 1) (If patient refuses exam can't bill higher level) (Make sure to document decubitus ulcers present on admission -- if possible -- and whether patient has chronic indwelling catheter at time of admission)  Labs on Admission: I have personally reviewed following labs and imaging studies  CBC: Recent Labs  Lab 07/03/19 2045  WBC 7.4  NEUTROABS 4.2  HGB 12.0*  HCT 36.8*  MCV 81.4  PLT 992   Basic Metabolic Panel: Recent Labs  Lab 07/03/19 2045 07/04/19 0037  NA 124* 126*  K 4.4 4.7  CL 91* 95*  CO2 25 23  GLUCOSE 103* 104*  BUN 22 20  CREATININE  1.65* 1.46*  CALCIUM 9.4 9.0  MG 1.7  --    GFR: Estimated Creatinine Clearance: 42.4 mL/min (A) (by C-G formula based on SCr of 1.46 mg/dL (H)). Liver Function Tests: Recent Labs  Lab 07/03/19 2045  AST 18  ALT 11  ALKPHOS 60  BILITOT 1.1  PROT 8.0  ALBUMIN 3.9   No results for input(s): LIPASE, AMYLASE in the last 168 hours. No results for input(s): AMMONIA in the last 168 hours. Coagulation Profile: No results for input(s): INR, PROTIME in the last 168 hours. Cardiac Enzymes: No results for input(s): CKTOTAL, CKMB, CKMBINDEX, TROPONINI in the last 168 hours. BNP (last 3 results) No results for input(s): PROBNP in the last 8760 hours. HbA1C: No results for input(s): HGBA1C in the last 72 hours. CBG: No results for input(s): GLUCAP in the last 168 hours. Lipid Profile: No results for input(s): CHOL, HDL, LDLCALC, TRIG, CHOLHDL, LDLDIRECT in the last 72  hours. Thyroid Function Tests: No results for input(s): TSH, T4TOTAL, FREET4, T3FREE, THYROIDAB in the last 72 hours. Anemia Panel: No results for input(s): VITAMINB12, FOLATE, FERRITIN, TIBC, IRON, RETICCTPCT in the last 72 hours. Urine analysis:    Component Value Date/Time   COLORURINE YELLOW 07/03/2019 2110   APPEARANCEUR CLEAR 07/03/2019 2110   LABSPEC 1.008 07/03/2019 2110   PHURINE 6.0 07/03/2019 2110   GLUCOSEU NEGATIVE 07/03/2019 2110   HGBUR MODERATE (A) 07/03/2019 2110   BILIRUBINUR NEGATIVE 07/03/2019 2110   KETONESUR NEGATIVE 07/03/2019 2110   PROTEINUR NEGATIVE 07/03/2019 2110   NITRITE NEGATIVE 07/03/2019 2110   LEUKOCYTESUR MODERATE (A) 07/03/2019 2110    Radiological Exams on Admission: CT Head Wo Contrast  Result Date: 07/03/2019 CLINICAL DATA:  Seizure, nontraumatic EXAM: CT HEAD WITHOUT CONTRAST TECHNIQUE: Contiguous axial images were obtained from the base of the skull through the vertex without intravenous contrast. COMPARISON:  03/17/2017 FINDINGS: Brain: Chronic small vessel ischemic  changes are seen scattered throughout the periventricular white matter, stable. No acute infarct or hemorrhage. Lateral ventricles and midline structures are unremarkable. There is diffuse cerebral atrophy, stable. No acute extra-axial fluid collections. No mass effect. Vascular: No hyperdense vessel or unexpected calcification. Skull: Normal. Negative for fracture or focal lesion. 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 No significant change since prior exam. Other: None. IMPRESSION: 1. No acute intracranial process. 2. Chronic small vessel ischemic changes, stable. 3. Continued expansile right maxillary sinus disease, without significant change. Electronically Signed   By: Randa Ngo M.D.   On: 07/03/2019 22:38    EKG: Independently reviewed  Assessment/Plan  Principal problem  Hyponatremia Patient had a sodium level of 124 on arrival to the ED. IV normal saline at the rate of 125 mL/h ordered. BMP every 4 hours ordered. The goal is to increase the sodium level from 8 to 10 mEq in 24 hours. Continue to monitor  Active Problems: Seizure-like activity Patient had witnessed seizure-like activity and has no history of seizure disorder. CT head negative for acute intracranial pathology. Seizure-like activity most likely related with hyponatremia  Patient did not have any seizures since brought to the hospital.  Continue to monitor and if the patient had any seizure activity again.  Consider neurology consult and EEG    Alcohol abuse Alcohol level was less than 10. Continue home folate and thiamine    AKI (acute kidney injury) ([place]) Continue IV fluids Continue to monitor creatinine level        DVT prophylaxis: Heparin Code Status: Full code Family Communication: No family member present at the bedside Disposition Plan:  Consults called: None Admission status:  Observation/telemetry   Edmonia Lynch MD [facility] Pager [phone]   If 7PM-7AM, please contact night-coverage [email or web address] Password  07/04/2019, 5:52 AM

## 2019-07-04 NOTE — Progress Notes (Signed)
PROGRESS NOTE  Anthony Burton GYF:[id number] DOB: 09-14-1940 DOA: 07/03/2019 PCP: Rosita Fire, MD  Brief History:  79 year old male with a history of subdural hematoma, hypertension, and alcohol abuse in remission presenting from [facility] secondary to seizure-like activity.  Apparently, the patient was playing bingo at [facility] when a worker at the facility noted the patient to have some jerking type movements and saw the patient's eyes "rolled behind his head".  According to the patient, he was quite aware of the entire event.  He stated that he was feeling sleepy and drowsy and wanted to go take a nap.  In fact, the patient stated that he began dozing off and stated that he was laying his head back on his chair intermittently.  He stated that one of the assistance at the facility came by to wake him up.  The patient stated that he woke up immediately and was alert and oriented and recognized the worker.  He had denied any grogginess.  He denied any headache, visual disturbance, focal extremity weakness, dysarthria.  He did not bite his tongue or have any incontinence.  The patient denies any new medications.  He denies any alcohol or illegal drug use.  He has not had any fevers, chills, chest pain, shortness breath, cough, hemoptysis, nausea, vomiting, diarrhea, abdominal pain.  Because of concerns of seizure, the patient was brought to the emergency department for further evaluation.  In the ED, the patient was noted to have hyponatremia with sodium 124 and serum creatinine 1.65.  LFTs and CBC were essentially unremarkable.  Assessment/Plan: Hyponatremia -Likely secondary to volume depletion and poor solute intake -Slowly improving on IV fluids -Patient has baseline hyponatremia with sodium 120-130 -Urine sodium -Urine creatinine -Serum osmolarity -Urine osmolarity -May be in part due to the patient's Megace use  Seizure-like activity -Given the patient's description, very  low suspicion of seizure -Obtain EEG -Alcohol level negative -Urine drug screen negative  -CT brain negative for acute findings  Hypertension -Continue metoprolol tartrate  Acute kidney injury -Secondary to volume depletion -Suspect the patient may have a degree of progression of underlying CKD -Baseline creatinine 0.5-0.8 -Continue IV fluids -Renal ultrasound    Status is: Observation  The patient remains OBS appropriate and will d/c before 2 midnights.  Dispo: The patient is from: ALF              Anticipated d/c is to: ALF              Anticipated d/c date is: 1 day              Patient currently is not medically stable to d/c.        Family Communication:  No Family at bedside  Consultants:  none  Code Status:  FULL   DVT Prophylaxis:  [place] Heparin / [place] Lovenox   Procedures: As Listed in Progress Note Above  Antibiotics: None   Total time spent 35 minutes.  Greater than 50% spent face to face counseling and coordinating care.     Subjective: Patient denies fevers, chills, headache, chest pain, dyspnea, nausea, vomiting, diarrhea, abdominal pain, dysuria, hematuria, hematochezia, and melena.   Objective: Vitals:   07/04/19 0500 07/04/19 0530 07/04/19 0600 07/04/19 0630  BP: 136/88 (!) 149/89 (!) 145/92 131/79  Pulse: 78 78 77 65  Resp: 19 17 16 16   Temp:      TempSrc:      SpO2:  99% 99% 99% 97%  Weight:      Height:       No intake or output data in the 24 hours ending 07/04/19 0809 Weight change:  Exam:   General:  Pt is alert, follows commands appropriately, not in acute distress  HEENT: No icterus, No thrush, No neck mass, [place]/AT  Cardiovascular: RRR, S1/S2, no rubs, no gallops  Respiratory: diminished BS;  Bibasilar rales. No wheeze  Abdomen: Soft/+BS, non tender, non distended, no guarding  Extremities: No edema, No lymphangitis, No petechiae, No rashes, no synovitis  Neuro:  CN II-XII intact, strength 4/5 in RUE, RLE,  strength 4/5 LUE, LLE; sensation intact bilateral; no dysmetria; babinski equivocal     Data Reviewed: I have personally reviewed following labs and imaging studies Basic Metabolic Panel: Recent Labs  Lab 07/03/19 2045 07/04/19 0037 07/04/19 0608  NA 124* 126* 128*  K 4.4 4.7 4.3  CL 91* 95* 99  CO2 25 23 22   GLUCOSE 103* 104* 96  BUN 22 20 18   CREATININE 1.65* 1.46* 1.43*  CALCIUM 9.4 9.0 9.1  MG 1.7  --   --    Liver Function Tests: Recent Labs  Lab 07/03/19 2045  AST 18  ALT 11  ALKPHOS 60  BILITOT 1.1  PROT 8.0  ALBUMIN 3.9   No results for input(s): LIPASE, AMYLASE in the last 168 hours. No results for input(s): AMMONIA in the last 168 hours. Coagulation Profile: No results for input(s): INR, PROTIME in the last 168 hours. CBC: Recent Labs  Lab 07/03/19 2045 07/04/19 0608  WBC 7.4 5.7  NEUTROABS 4.2  --   HGB 12.0* 11.6*  HCT 36.8* 34.9*  MCV 81.4 81.0  PLT 237 236   Cardiac Enzymes: No results for input(s): CKTOTAL, CKMB, CKMBINDEX, TROPONINI in the last 168 hours. BNP: Invalid input(s): POCBNP CBG: No results for input(s): GLUCAP in the last 168 hours. HbA1C: No results for input(s): HGBA1C in the last 72 hours. Urine analysis:    Component Value Date/Time   COLORURINE YELLOW 07/03/2019 2110   APPEARANCEUR CLEAR 07/03/2019 2110   LABSPEC 1.008 07/03/2019 2110   PHURINE 6.0 07/03/2019 2110   GLUCOSEU NEGATIVE 07/03/2019 2110   HGBUR MODERATE (A) 07/03/2019 2110   BILIRUBINUR NEGATIVE 07/03/2019 2110   [place] NEGATIVE 07/03/2019 2110   PROTEINUR NEGATIVE 07/03/2019 2110   NITRITE NEGATIVE 07/03/2019 2110   LEUKOCYTESUR MODERATE (A) 07/03/2019 2110   Sepsis Labs: @LABRCNTIP (procalcitonin:4,lacticidven:4) ) Recent Results (from the past 240 hour(s))  SARS Coronavirus 2 by RT PCR (hospital order, performed in [facility] hospital lab) Nasopharyngeal Nasopharyngeal Swab     Status: None   Collection Time: 07/03/19 10:03 PM   Specimen:  Nasopharyngeal Swab  Result Value Ref Range Status   SARS Coronavirus 2 NEGATIVE NEGATIVE Final    Comment: (NOTE) SARS-CoV-2 target nucleic acids are NOT DETECTED. The SARS-CoV-2 RNA is generally detectable in upper and lower respiratory specimens during the acute phase of infection. The lowest concentration of SARS-CoV-2 viral copies this assay can detect is 250 copies / mL. A negative result does not preclude SARS-CoV-2 infection and should not be used as the sole basis for treatment or other patient management decisions.  A negative result may occur with improper specimen collection / handling, submission of specimen other than nasopharyngeal swab, presence of viral mutation(s) within the areas targeted by this assay, and inadequate number of viral copies (<250 copies / mL). A negative result must be combined with clinical observations, patient history,  and epidemiological information. Fact Sheet for Patients:   StrictlyIdeas.no Fact Sheet for Healthcare Providers: BankingDealers.co.za This test is not yet approved or cleared  by the [place] FDA and has been authorized for detection and/or diagnosis of SARS-CoV-2 by FDA under an Emergency Use Authorization (EUA).  This EUA will remain in effect (meaning this test can be used) for the duration of the COVID-19 declaration under Section 564(b)(1) of the Act, 21 U.S.C. section 360bbb-3(b)(1), unless the authorization is terminated or revoked sooner. Performed at [facility], [address] [place], [place] [address]      Scheduled Meds: . folic acid  1 mg Oral Daily  . heparin  5,000 Units Subcutaneous Q8H  . megestrol  400 mg Oral Daily  . metoprolol tartrate  25 mg Oral BID  . thiamine  100 mg Oral Daily  . vitamin B-12  1,000 mcg Oral Daily   Continuous Infusions: . sodium chloride 125 mL/hr at 07/04/19 0021    Procedures/Studies: CT Head Wo Contrast  Result Date:  07/03/2019 CLINICAL DATA:  Seizure, nontraumatic EXAM: CT HEAD WITHOUT CONTRAST TECHNIQUE: Contiguous axial images were obtained from the base of the skull through the vertex without intravenous contrast. COMPARISON:  03/17/2017 FINDINGS: Brain: Chronic small vessel ischemic changes are seen scattered throughout the periventricular white matter, stable. No acute infarct or hemorrhage. Lateral ventricles and midline structures are unremarkable. There is diffuse cerebral atrophy, stable. No acute extra-axial fluid collections. No mass effect. Vascular: No hyperdense vessel or unexpected calcification. Skull: Normal. Negative for fracture or focal lesion. 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 No significant change since prior exam. Other: None. IMPRESSION: 1. No acute intracranial process. 2. Chronic small vessel ischemic changes, stable. 3. Continued expansile right maxillary sinus disease, without significant change. Electronically Signed   By: Randa Ngo M.D.   On: 07/03/2019 22:38    Orson Eva, DO  [facility]  If 7PM-7AM, please contact night-coverage [email or web address] Password [email or web address] 07/04/2019, 8:09 AM   LOS: 0 days

## 2019-07-05 ENCOUNTER — Inpatient Hospital Stay (HOSPITAL_COMMUNITY)
Admit: 2019-07-05 | Discharge: 2019-07-05 | Disposition: A | Discharge status: Home / Self Care | Payer: Medicare Other | Attending: Internal Medicine | Admitting: Internal Medicine

## 2019-07-05 DIAGNOSIS — N179 Acute kidney failure, unspecified: Principal | ICD-10-CM

## 2019-07-05 DIAGNOSIS — R569 Unspecified convulsions: Secondary | ICD-10-CM

## 2019-07-05 LAB — BASIC METABOLIC PANEL
Anion gap: 8 (ref 5–15)
BUN: 18 mg/dL (ref 8–23)
CO2: 22 mmol/L (ref 22–32)
Calcium: 9 mg/dL (ref 8.9–10.3)
Chloride: 101 mmol/L (ref 98–111)
Creatinine, Ser: 1.43 mg/dL — ABNORMAL HIGH (ref 0.61–1.24)
GFR calc Af Amer: 54 mL/min — ABNORMAL LOW (ref >60)
GFR calc non Af Amer: 47 mL/min — ABNORMAL LOW (ref >60)
Glucose, Bld: 93 mg/dL (ref 70–99)
Potassium: 4.2 mmol/L (ref 3.5–5.1)
Sodium: 131 mmol/L — ABNORMAL LOW (ref 135–145)

## 2019-07-05 LAB — MAGNESIUM: Magnesium: 2 mg/dL (ref 1.7–2.4)

## 2019-07-05 LAB — GLUCOSE, CAPILLARY: Glucose-Capillary: 97 mg/dL (ref 70–99)

## 2019-07-05 NOTE — Procedures (Addendum)
Patient Name: Anthony Burton  MRN: [id number]  Epilepsy Attending: Lora Havens  Referring Physician/Provider: Dr. Shanon Brow Tat Date: 07/05/2019 Duration: 25.38 minutes  Patient history: 79 year old male presented with seizure-like episode described as jerking movements and eyes rolling in the back of the head.  EEG evaluate for seizures.  Level of alertness: Awake, drowsy  AEDs during EEG study: None  Technical aspects: This EEG study was done with scalp electrodes positioned according to the 10-20 International system of electrode placement. Electrical activity was acquired at a sampling rate of 500Hz  and reviewed with a high frequency filter of 70Hz  and a low frequency filter of 1Hz . EEG data were recorded continuously and digitally stored.   Description: The posterior dominant rhythm consists of 8Hz  activity of moderate voltage (25-35 uV) seen predominantly in posterior head regions, symmetric and reactive to eye opening and eye closing. Sleep was characterized by vertex waves,  maximal frontocentral region.  Physiology photic driving was not seen during photic stimulation.  Hyperventilation was not performed.     IMPRESSION: This study is within normal limits. No seizures or epileptiform discharges were seen throughout the recording.  Billee Balcerzak Barbra Sarks

## 2019-07-05 NOTE — Discharge Summary (Signed)
Physician Discharge Summary  [name] FYB:[id number] DOB: 05/26/1940 DOA: 07/03/2019  PCP: Rosita Fire, MD  Admit date: 07/03/2019 Discharge date: 07/05/2019  Admitted From: [facility] Disposition:  [facility]  Recommendations for Outpatient Follow-up:  1. Follow up with PCP in 1-2 weeks 2. Please obtain BMP/CBC in one week   Home Health: yes Equipment/Devices: HHPT, HHRN  Discharge Condition: Stable CODE STATUS: FULL Diet recommendation: Heart Healthy  Brief/Interim Summary: 79 year old male with a history of subdural hematoma, hypertension, and alcohol abuse in remission presenting from [facility] secondary to seizure-like activity.  Apparently, the patient was playing bingo at [facility] when a worker at the facility noted the patient to have some jerking type movements and saw the patient's eyes "rolled behind his head".  According to the patient, he was quite aware of the entire event.  He stated that he was feeling sleepy and drowsy and wanted to go take a nap.  In fact, the patient stated that he began dozing off and stated that he was laying his head back on his chair intermittently.  He stated that one of the assistance at the facility came by to wake him up.  The patient stated that he woke up immediately and was alert and oriented and recognized the worker.  He had denied any grogginess.  He denied any headache, visual disturbance, focal extremity weakness, dysarthria.  He did not bite his tongue or have any incontinence.  The patient denies any new medications.  He denies any alcohol or illegal drug use.  He has not had any fevers, chills, chest pain, shortness breath, cough, hemoptysis, nausea, vomiting, diarrhea, abdominal pain.  Because of concerns of seizure, the patient was brought to the emergency department for further evaluation.  In the ED, the patient was noted to have hyponatremia with sodium 124 and serum creatinine 1.65.  LFTs and CBC were essentially  unremarkable.  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 -Urine sodium--not collected by staff -Urine creatinine-not collected by staff -Urine osmolarity-not collected by staff -May be in part due to the patient's Megace use -Na = 131 on day of d/c  Seizure-like activity -Given the patient's description, very low suspicion of seizure -Obtain EEG--WNL -Alcohol level negative -Urine drug screen negative  -CT brain negative for acute findings -no seizure activity during hospitalization -will not start AEDs  Hypertension -Continue metoprolol tartrate  Acute kidney injury -Secondary to volume depletion -Suspect the patient may have a degree of progression of underlying CKD -Baseline creatinine 0.5-0.8 -Continue IV fluids -Renal ultrasound--no hydronephrosis -suspect new baseline is 1.4-1.6  Discharge Instructions   Allergies as of 07/05/2019   No Known Allergies     Medication List    STOP taking these medications   ibuprofen 800 MG tablet Commonly known as: ADVIL     TAKE these medications   Calcium 600+D 600-400 MG-UNIT tablet Generic drug: Calcium Carbonate-Vitamin D Take 1 tablet by mouth 2 (two) times daily.   chlorhexidine 0.12 % solution Commonly known as: PERIDEX 15 mLs by Mouth Rinse route 2 (two) times daily.   folic acid 1 MG tablet Commonly known as: FOLVITE Take 1 tablet (1 mg total) by mouth daily.   megestrol 40 MG/ML suspension Commonly known as: MEGACE Take 10 mLs (400 mg total) by mouth daily.   metoprolol tartrate 25 MG tablet Commonly known as: LOPRESSOR Take 25 mg by  mouth 2 (two) times daily.   multivitamin with minerals Tabs tablet Take 1 tablet by mouth daily.   thiamine 100 MG tablet Take 1 tablet (100 mg total) by mouth daily.   vitamin B-12 1000 MCG tablet Commonly known as:  CYANOCOBALAMIN Take 1,000 mcg by mouth daily.       No Known Allergies  Consultations:  none   Procedures/Studies: CT Head Wo Contrast  Result Date: 07/03/2019 CLINICAL DATA:  Seizure, nontraumatic EXAM: CT HEAD WITHOUT CONTRAST TECHNIQUE: Contiguous axial images were obtained from the base of the skull through the vertex without intravenous contrast. COMPARISON:  03/17/2017 FINDINGS: Brain: Chronic small vessel ischemic changes are seen scattered throughout the periventricular white matter, stable. No acute infarct or hemorrhage. Lateral ventricles and midline structures are unremarkable. There is diffuse cerebral atrophy, stable. No acute extra-axial fluid collections. No mass effect. Vascular: No hyperdense vessel or unexpected calcification. Skull: Normal. Negative for fracture or focal lesion. 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 No significant change since prior exam. Other: None. IMPRESSION: 1. No acute intracranial process. 2. Chronic small vessel ischemic changes, stable. 3. Continued expansile right maxillary sinus disease, without significant change. Electronically Signed   By: Randa Ngo M.D.   On: 07/03/2019 22:38   US RENAL  Result Date: 07/04/2019 CLINICAL DATA:  Acute renal insufficiency. EXAM: RENAL / URINARY TRACT ULTRASOUND COMPLETE COMPARISON:  None. FINDINGS: Right Kidney: Renal measurements: 11 x 3.8 x 6.6 cm = volume: 144 mL . Echogenicity within normal limits. No mass or hydronephrosis visualized. Left Kidney: Renal measurements: 10.1 by 5.3 x 5.4 cm = volume: 151 mL. Echogenicity within normal limits. No mass or hydronephrosis visualized. Bladder: Appears normal for degree of bladder distention. Other: None. IMPRESSION: Normal study.  No cause for acute renal insufficiency identified. Electronically Signed   By: Dorise Bullion III M.D   On: 07/04/2019 12:51   EEG  adult  Result Date: 07/05/2019 Lora Havens, MD     07/05/2019 12:46 PM Patient Name: Anthony Burton MRN: [id number] Epilepsy Attending: Lora Havens Referring Physician/Provider: Dr. Shanon Brow Jordanne Elsbury Date: 07/05/2019 Duration: 25.38 minutes Patient history: 79 year old male presented with seizure-like episode described as jerking movements and eyes rolling in the back of the head.  EEG evaluate for seizures. Level of alertness: Awake, drowsy AEDs during EEG study: None Technical aspects: This EEG study was done with scalp electrodes positioned according to the 10-20 International system of electrode placement. Electrical activity was acquired at a sampling rate of 500Hz  and reviewed with a high frequency filter of 70Hz  and a low frequency filter of 1Hz . EEG data were recorded continuously and digitally stored. Description: The posterior dominant rhythm consists of 8Hz  activity of moderate voltage (25-35 uV) seen predominantly in posterior head regions, symmetric and reactive to eye opening and eye closing. Sleep was characterized by vertex waves,  maximal frontocentral region.  Physiology photic driving was not seen during photic stimulation.  Hyperventilation was not performed.   IMPRESSION: This study is within normal limits. No seizures or epileptiform discharges were seen throughout the recording. Lora Havens        Discharge Exam: Vitals:   07/04/19 2020 07/05/19 0527  BP: 126/89 140/75  Pulse: 87 70  Resp: 16 17  Temp: 99.2 F (37.3 C) 98.2 F (36.8 C)  SpO2: 98% 98%   Vitals:   07/04/19 1529 07/04/19 1857 07/04/19 2020 07/05/19 0527  BP: (!) 158/94 Marland Kitchen)  144/80 126/89 140/75  Pulse: 74 79 87 70  Resp: 20 18 16 17   Temp: 98.3 F (36.8 C) 98 F (36.7 C) 99.2 F (37.3 C) 98.2 F (36.8 C)  TempSrc: Oral Oral Axillary Oral  SpO2: 100% 100% 98% 98%  Weight:      Height:        General: Pt is alert, awake, not in acute distress Cardiovascular: RRR, S1/S2 +, no rubs, no  gallops Respiratory: CTA bilaterally, no wheezing, no rhonchi Abdominal: Soft, NT, ND, bowel sounds + Extremities: no edema, no cyanosis   The results of significant diagnostics from this hospitalization (including imaging, microbiology, ancillary and laboratory) are listed below for reference.    Significant Diagnostic Studies: CT Head Wo Contrast  Result Date: 07/03/2019 CLINICAL DATA:  Seizure, nontraumatic EXAM: CT HEAD WITHOUT CONTRAST TECHNIQUE: Contiguous axial images were obtained from the base of the skull through the vertex without intravenous contrast. COMPARISON:  03/17/2017 FINDINGS: Brain: Chronic small vessel ischemic changes are seen scattered throughout the periventricular white matter, stable. No acute infarct or hemorrhage. Lateral ventricles and midline structures are unremarkable. There is diffuse cerebral atrophy, stable. No acute extra-axial fluid collections. No mass effect. Vascular: No hyperdense vessel or unexpected calcification. Skull: Normal. Negative for fracture or focal lesion. 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 No significant change since prior exam. Other: None. IMPRESSION: 1. No acute intracranial process. 2. Chronic small vessel ischemic changes, stable. 3. Continued expansile right maxillary sinus disease, without significant change. Electronically Signed   By: Randa Ngo M.D.   On: 07/03/2019 22:38   US RENAL  Result Date: 07/04/2019 CLINICAL DATA:  Acute renal insufficiency. EXAM: RENAL / URINARY TRACT ULTRASOUND COMPLETE COMPARISON:  None. FINDINGS: Right Kidney: Renal measurements: 11 x 3.8 x 6.6 cm = volume: 144 mL . Echogenicity within normal limits. No mass or hydronephrosis visualized. Left Kidney: Renal measurements: 10.1 by 5.3 x 5.4 cm = volume: 151 mL. Echogenicity within normal limits. No mass or hydronephrosis visualized. Bladder: Appears normal for  degree of bladder distention. Other: None. IMPRESSION: Normal study.  No cause for acute renal insufficiency identified. Electronically Signed   By: Dorise Bullion III M.D   On: 07/04/2019 12:51   EEG adult  Result Date: 07/05/2019 Lora Havens, MD     07/05/2019 12:46 PM Patient Name: Anthony Burton MRN: [id number] Epilepsy Attending: Lora Havens Referring Physician/Provider: Dr. Shanon Brow Jackqulyn Mendel Date: 07/05/2019 Duration: 25.38 minutes Patient history: 78 year old male presented with seizure-like episode described as jerking movements and eyes rolling in the back of the head.  EEG evaluate for seizures. Level of alertness: Awake, drowsy AEDs during EEG study: None Technical aspects: This EEG study was done with scalp electrodes positioned according to the 10-20 International system of electrode placement. Electrical activity was acquired at a sampling rate of 500Hz  and reviewed with a high frequency filter of 70Hz  and a low frequency filter of 1Hz . EEG data were recorded continuously and digitally stored. Description: The posterior dominant rhythm consists of 8Hz  activity of moderate voltage (25-35 uV) seen predominantly in posterior head regions, symmetric and reactive to eye opening and eye closing. Sleep was characterized by vertex waves,  maximal frontocentral region.  Physiology photic driving was not seen during photic stimulation.  Hyperventilation was not performed.   IMPRESSION: This study is within normal limits. No seizures or epileptiform discharges were seen throughout the recording. Priyanka Barbra Sarks  Microbiology: Recent Results (from the past 240 hour(s))  SARS Coronavirus 2 by RT PCR (hospital order, performed in [facility] hospital lab) Nasopharyngeal Nasopharyngeal Swab     Status: None   Collection Time: 07/03/19 10:03 PM   Specimen: Nasopharyngeal Swab  Result Value Ref Range Status   SARS Coronavirus 2 NEGATIVE NEGATIVE Final    Comment: (NOTE) SARS-CoV-2 target nucleic  acids are NOT DETECTED. The SARS-CoV-2 RNA is generally detectable in upper and lower respiratory specimens during the acute phase of infection. The lowest concentration of SARS-CoV-2 viral copies this assay can detect is 250 copies / mL. A negative result does not preclude SARS-CoV-2 infection and should not be used as the sole basis for treatment or other patient management decisions.  A negative result may occur with improper specimen collection / handling, submission of specimen other than nasopharyngeal swab, presence of viral mutation(s) within the areas targeted by this assay, and inadequate number of viral copies (<250 copies / mL). A negative result must be combined with clinical observations, patient history, and epidemiological information. Fact Sheet for Patients:   StrictlyIdeas.no Fact Sheet for Healthcare Providers: BankingDealers.co.za This test is not yet approved or cleared  by the [place] FDA and has been authorized for detection and/or diagnosis of SARS-CoV-2 by FDA under an Emergency Use Authorization (EUA).  This EUA will remain in effect (meaning this test can be used) for the duration of the COVID-19 declaration under Section 564(b)(1) of the Act, 21 U.S.C. section 360bbb-3(b)(1), unless the authorization is terminated or revoked sooner. Performed at [facility], [address], [place], [place] [address]      Labs: Basic Metabolic Panel: Recent Labs  Lab 07/03/19 2045 07/03/19 2045 07/04/19 0037 07/04/19 0037 07/04/19 0608 07/05/19 0555  NA 124*  --  126*  --  128* 131*  K 4.4   < > 4.7   < > 4.3 4.2  CL 91*  --  95*  --  99 101  CO2 25  --  23  --  22 22  GLUCOSE 103*  --  104*  --  96 93  BUN 22  --  20  --  18 18  CREATININE 1.65*  --  1.46*  --  1.43* 1.43*  CALCIUM 9.4  --  9.0  --  9.1 9.0  MG 1.7  --   --   --   --  2.0   < > = values in this interval not displayed.   Liver Function  Tests: Recent Labs  Lab 07/03/19 2045  AST 18  ALT 11  ALKPHOS 60  BILITOT 1.1  PROT 8.0  ALBUMIN 3.9   No results for input(s): LIPASE, AMYLASE in the last 168 hours. No results for input(s): AMMONIA in the last 168 hours. CBC: Recent Labs  Lab 07/03/19 2045 07/04/19 0608  WBC 7.4 5.7  NEUTROABS 4.2  --   HGB 12.0* 11.6*  HCT 36.8* 34.9*  MCV 81.4 81.0  PLT 237 236   Cardiac Enzymes: Recent Labs  Lab 07/04/19 0833  CKTOTAL 90   BNP: Invalid input(s): POCBNP CBG: No results for input(s): GLUCAP in the last 168 hours.  Time coordinating discharge:  36 minutes  Signed:  Orson Eva, DO [facility] Pager: [phone] 07/05/2019, 1:12 PM

## 2019-07-05 NOTE — TOC Progression Note (Signed)
Transition of Care [facility]) - Progression Note    Patient Details  Name: Anthony Burton MRN: [id number] Date of Birth: [birth year]  Transition of Care [facility]) CM/SW Contact  Boneta Lucks, RN Phone Number: 07/05/2019, 2:03 PM  Clinical Narrative:   Patient admitted with Hyponatremia. Patient is from [facility]. TOC spoke with Aram Beecham, she is requesting [place] PT/RN from [facility]. Santiago Glad accepted the referral. Order are in . Faxed DC summary and FL2 to facility.     Expected Discharge Plan: Assisted Living Barriers to Discharge: Barriers Resolved  Expected Discharge Plan and Services Expected Discharge Plan: Assisted Living      Expected Discharge Date: 07/05/19                    HH Arranged: RN, PT [facility] Agency: [place] Date [facility] Agency Contacted: 07/05/19 Time [place]: [phone] Representative spoke with at House: Santiago Glad

## 2019-07-05 NOTE — Progress Notes (Signed)
EEG complete - results pending 

## 2019-07-05 NOTE — NC FL2 (Signed)
[place] LEVEL OF CARE SCREENING TOOL     IDENTIFICATION  Patient Name: Anthony Burton Birthdate: 04-26-1940 Sex: male Admission Date (Current Location): 07/03/2019  [facility] and [place] Number:  [employer] and Address:  [place] [address], [place]      Provider Number: [id number]  Attending Physician Name and Address:  Orson Eva, MD  Relative Name and Phone Number:  [place] - sister    Current Level of Care: Hospital Recommended Level of Care: [place] Prior Approval Number:    Date Approved/Denied:   PASRR Number:    Discharge Plan: Domiciliary (Rest home)    Current Diagnoses: Patient Active Problem List   Diagnosis Date Noted  . AKI (acute kidney injury) ([place]) 07/04/2019  . Seizure-like activity ([place]) 07/04/2019  . Malnutrition of moderate degree 03/19/2017  . Palliative care encounter   . Gout 03/17/2017  . Alcohol abuse 03/17/2017  . Falls 03/17/2017  . Weakness 03/17/2017  . Hyponatremia 03/17/2017  . Elevated troponin 03/17/2017  . Bilateral subdural hematomas ([place]) 03/17/2017  . Maxillary sinusitis 03/17/2017    Orientation RESPIRATION BLADDER Height & Weight     Self, Time, Situation, Place  Normal Continent Weight: 77.1 kg Height:  5\' 7"  (170.2 cm)  BEHAVIORAL SYMPTOMS/MOOD NEUROLOGICAL BOWEL NUTRITION STATUS      Continent Diet(Heart Healthy)  AMBULATORY STATUS COMMUNICATION OF NEEDS Skin   Extensive Assist Verbally Normal                       Personal Care Assistance Level of Assistance  Bathing, Feeding, Dressing Bathing Assistance: Maximum assistance Feeding assistance: Limited assistance Dressing Assistance: Maximum assistance     Functional Limitations Info  Sight, Hearing, Speech Sight Info: Adequate Hearing Info: Adequate Speech Info: Adequate    SPECIAL CARE FACTORS FREQUENCY  PT (By licensed PT)     PT Frequency: 3 times a week               Contractures Contractures Info: Not present    Additional Factors Info  Code Status, Allergies Code Status Info: FULL Allergies Info: NKDA           Current Medications (07/05/2019):  This is the current hospital active medication list Current Facility-Administered Medications  Medication Dose Route Frequency Provider Last Rate Last Admin  . acetaminophen (TYLENOL) tablet 650 mg  650 mg Oral Q6H PRN Bunnie Pion Z, DO       Or  . acetaminophen (TYLENOL) suppository 650 mg  650 mg Rectal Q6H PRN Bunnie Pion Z, DO      . folic acid (FOLVITE) tablet 1 mg  1 mg Oral Daily Bunnie Pion Z, DO   1 mg at 07/05/19 0836  . heparin injection 5,000 Units  5,000 Units Subcutaneous Q8H Bunnie Pion Z, DO   5,000 Units at 07/05/19 [phone]  . megestrol (MEGACE) 40 MG/ML suspension 400 mg  400 mg Oral Daily Bunnie Pion Z, DO   400 mg at 07/05/19 2025  . metoprolol tartrate (LOPRESSOR) tablet 25 mg  25 mg Oral BID Bunnie Pion Z, DO   25 mg at 07/05/19 4270  . ondansetron (ZOFRAN) tablet 4 mg  4 mg Oral Q6H PRN Bunnie Pion Z, DO       Or  . ondansetron [facility]) injection 4 mg  4 mg Intravenous Q6H PRN Bunnie Pion Z, DO      . thiamine tablet 100 mg  100 mg Oral Daily Bunnie Pion Z, DO   100 mg at 07/05/19 5643  . vitamin B-12 (CYANOCOBALAMIN) tablet 1,000 mcg  1,000 mcg Oral Daily Bunnie Pion Z, DO   1,000 mcg at 07/05/19 3295     Discharge Medications:    Medication List    STOP taking these medications   ibuprofen 800 MG tablet Commonly known as: ADVIL     TAKE these medications   Calcium 600+D 600-400 MG-UNIT tablet Generic drug: Calcium Carbonate-Vitamin D Take 1 tablet by mouth 2 (two) times daily.   chlorhexidine 0.12 % solution Commonly known as: PERIDEX 15 mLs by Mouth Rinse route 2 (two) times daily.   folic acid 1 MG tablet Commonly known as: FOLVITE Take 1 tablet (1 mg total) by mouth daily.   megestrol 40 MG/ML  suspension Commonly known as: MEGACE Take 10 mLs (400 mg total) by mouth daily.   metoprolol tartrate 25 MG tablet Commonly known as: LOPRESSOR Take 25 mg by mouth 2 (two) times daily.   multivitamin with minerals Tabs tablet Take 1 tablet by mouth daily.   thiamine 100 MG tablet Take 1 tablet (100 mg total) by mouth daily.   vitamin B-12 1000 MCG tablet Commonly known as: CYANOCOBALAMIN Take 1,000 mcg by mouth daily.      No Known Allergies  Relevant Imaging Results:  Relevant Lab Results:   Additional Information SS# [id number]  Boneta Lucks, RN

## 2019-07-05 NOTE — Progress Notes (Signed)
No visible signs of seizure today and EEG showed the same.  Alert and oriented.  IV removed and discharge instructions reviewed.  Transported by [facility] to entrance and [facility] staff gave ride in [place]

## 2021-04-27 ENCOUNTER — Other Ambulatory Visit (HOSPITAL_COMMUNITY): Payer: Self-pay | Admitting: Nephrology

## 2021-04-27 ENCOUNTER — Other Ambulatory Visit: Payer: Self-pay | Admitting: Nephrology

## 2021-04-27 DIAGNOSIS — Z716 Tobacco abuse counseling: Secondary | ICD-10-CM

## 2021-04-27 DIAGNOSIS — I129 Hypertensive chronic kidney disease with stage 1 through stage 4 chronic kidney disease, or unspecified chronic kidney disease: Secondary | ICD-10-CM

## 2021-04-27 DIAGNOSIS — E871 Hypo-osmolality and hyponatremia: Secondary | ICD-10-CM

## 2021-04-27 DIAGNOSIS — F1011 Alcohol abuse, in remission: Secondary | ICD-10-CM

## 2021-04-27 DIAGNOSIS — D638 Anemia in other chronic diseases classified elsewhere: Secondary | ICD-10-CM

## 2021-04-27 DIAGNOSIS — N1832 Chronic kidney disease, stage 3b: Secondary | ICD-10-CM

## 2021-05-07 ENCOUNTER — Other Ambulatory Visit: Payer: Self-pay

## 2021-05-07 ENCOUNTER — Ambulatory Visit (HOSPITAL_COMMUNITY)
Admission: RE | Admit: 2021-05-07 | Discharge: 2021-05-07 | Disposition: A | Discharge status: Home / Self Care | Payer: Medicare Other | Source: Ambulatory Visit | Attending: Nephrology | Admitting: Nephrology

## 2021-05-07 DIAGNOSIS — D638 Anemia in other chronic diseases classified elsewhere: Secondary | ICD-10-CM | POA: Insufficient documentation

## 2021-05-07 DIAGNOSIS — E871 Hypo-osmolality and hyponatremia: Secondary | ICD-10-CM | POA: Diagnosis present

## 2021-05-07 DIAGNOSIS — N1832 Chronic kidney disease, stage 3b: Secondary | ICD-10-CM | POA: Diagnosis not present

## 2021-05-07 DIAGNOSIS — F1011 Alcohol abuse, in remission: Secondary | ICD-10-CM | POA: Diagnosis present

## 2021-05-07 DIAGNOSIS — I129 Hypertensive chronic kidney disease with stage 1 through stage 4 chronic kidney disease, or unspecified chronic kidney disease: Secondary | ICD-10-CM | POA: Diagnosis present

## 2021-06-15 ENCOUNTER — Ambulatory Visit (HOSPITAL_COMMUNITY)
Admission: RE | Admit: 2021-06-15 | Discharge: 2021-06-15 | Disposition: A | Discharge status: Home / Self Care | Payer: Medicare Other | Source: Ambulatory Visit | Attending: Nephrology | Admitting: Nephrology

## 2021-06-15 ENCOUNTER — Other Ambulatory Visit (HOSPITAL_COMMUNITY): Payer: Self-pay | Admitting: Nephrology

## 2021-06-15 DIAGNOSIS — N1832 Chronic kidney disease, stage 3b: Secondary | ICD-10-CM

## 2021-06-21 ENCOUNTER — Encounter (INDEPENDENT_AMBULATORY_CARE_PROVIDER_SITE_OTHER): Payer: Self-pay | Admitting: *Deleted

## 2021-07-18 ENCOUNTER — Ambulatory Visit (INDEPENDENT_AMBULATORY_CARE_PROVIDER_SITE_OTHER): Payer: Medicare Other | Admitting: "Endocrinology

## 2021-07-18 ENCOUNTER — Encounter: Payer: Self-pay | Admitting: "Endocrinology

## 2021-07-18 DIAGNOSIS — R7303 Prediabetes: Secondary | ICD-10-CM

## 2021-07-18 DIAGNOSIS — E2749 Other adrenocortical insufficiency: Secondary | ICD-10-CM

## 2021-07-18 NOTE — Progress Notes (Signed)
Endocrinology Consult Note                                            07/18/2021, 4:54 PM   Subjective:    Patient ID: Anthony Burton, male    DOB: 05/26/1940, PCP Anthony Meiers, MD   Past Medical History:  Diagnosis Date   Gout    Kidney disease    No past surgical history on file. Social History   Socioeconomic History   Marital status: Married    Spouse name: Not on file   Number of children: Not on file   Years of education: Not on file   Highest education level: Not on file  Occupational History   Not on file  Tobacco Use   Smoking status: Every Day    Types: Cigarettes   Smokeless tobacco: Never  Vaping Use   Vaping Use: Never used  Substance and Sexual Activity   Alcohol use: Not Currently    Comment: daily use   Drug use: No   Sexual activity: Not on file  Other Topics Concern   Not on file  Social History Narrative   Not on file   Social Determinants of Health   Financial Resource Strain: Not on file  Food Insecurity: Not on file  Transportation Needs: Not on file  Physical Activity: Not on file  Stress: Not on file  Social Connections: Not on file   No family history on file. Outpatient Encounter Medications as of 07/18/2021  Medication Sig   chlorhexidine (PERIDEX) 0.12 % solution 15 mLs by Mouth Rinse route 2 (two) times daily.   folic acid (FOLVITE) 1 MG tablet Take 1 tablet (1 mg total) by mouth daily.   metoprolol tartrate (LOPRESSOR) 25 MG tablet Take 25 mg by mouth 2 (two) times daily.   Multiple Vitamin (MULTIVITAMIN WITH MINERALS) TABS tablet Take 1 tablet by mouth daily.   thiamine 100 MG tablet Take 1 tablet (100 mg total) by mouth daily.   [DISCONTINUED] Calcium Carbonate-Vitamin D (CALCIUM 600+D) 600-400 MG-UNIT tablet Take 1 tablet by mouth 2 (two) times daily.   [DISCONTINUED] megestrol (MEGACE) 40 MG/ML suspension Take 10 mLs (400 mg total) by mouth daily.   [DISCONTINUED] vitamin B-12 (CYANOCOBALAMIN) 1000 MCG  tablet Take 1,000 mcg by mouth daily.   No facility-administered encounter medications on file as of 07/18/2021.   ALLERGIES: No Known Allergies  VACCINATION STATUS: Immunization History  Administered Date(s) Administered   Influenza, High Dose Seasonal PF 03/18/2017   Pneumococcal Polysaccharide-23 03/18/2017   Tdap 03/16/2015    HPI Anthony Burton is 81 y.o. male who presents today with a medical history as above. Patient is unable to provide history.  He was dropped off from [facility] assisted living without his aide. -History is obtained from chart review.  The referral package mentions hypercalcemia, prediabetes, and hypocortisolemia. He is at assisted living due to multiple medical problems including ataxia, hypertension, tachyarrhythmia, gouty arthritis, history of alcohol dependence, protein calorie malnutrition, history of falls. His medication list includes Megace, vitamin B12, calcium/vitamin D, thiamine, metoprolol. Review of his medical records shows supraphysiologic levels of vitamin B12, hypercalcemia, hypocortisolemia, and prediabetes with A1c of 6%. Labs from outside facility showed most recent calcium of 11.1, PTH of 7.  Renal insufficiency with serum creatinine of 2.3 and estimated GFR of 28.  He denies pain, nausea/vomiting. He relates with a walker.     Review of Systems  Constitutional: + Fluctuating body weight, denies fatigue, no subjective hyperthermia, no subjective hypothermia Eyes: no blurry vision, no xerophthalmia ENT: no sore throat, no nodules palpated in throat, no dysphagia/odynophagia, no hoarseness Cardiovascular: no Chest Pain, no Shortness of Breath, no palpitations, no leg swelling Respiratory: no cough, no shortness of breath Gastrointestinal: no Nausea/Vomiting/Diarhhea Musculoskeletal: no muscle/joint aches Skin: no rashes Neurological: no tremors, no numbness, no tingling, no dizziness Psychiatric: no depression, no  anxiety  Objective:       07/18/2021    1:22 PM 07/05/2019    5:27 AM 07/04/2019    8:20 PM  Vitals with BMI  Height 5\' 9"     Weight 152 lbs 6 oz    BMI 74.0    Systolic 814 481 856  Diastolic 72 75 89  Pulse 72 70 87    BP 132/72   Pulse 72   Ht 5\' 9"  (1.753 m)   Wt 152 lb 6.4 oz (69.1 kg)   BMI 22.51 kg/m   Wt Readings from Last 3 Encounters:  07/18/21 152 lb 6.4 oz (69.1 kg)  07/03/19 170 lb (77.1 kg)  03/20/17 125 lb 7.1 oz (56.9 kg)    Physical Exam  Constitutional:  Body mass index is 22.51 kg/m.,  not in acute distress, normal state of mind, + cognitive deficit.  Chronically sick looking. Eyes: PERRLA, EOMI, no exophthalmos ENT: moist mucous membranes, no gross thyromegaly, no gross cervical lymphadenopathy, poor dentition/dental care. Cardiovascular: normal precordial activity, Regular Rate and Rhythm, no Murmur/Rubs/Gallops Respiratory:  adequate breathing efforts, no gross chest deformity, Clear to auscultation bilaterally Gastrointestinal: abdomen soft, Non -tender, No distension, Bowel Sounds present, no gross organomegaly Musculoskeletal: no gross deformities, strength intact in all four extremities Skin: moist, warm, no rashes Neurological: no tremor with outstretched hands, Deep tendon reflexes normal in bilateral lower extremities.  CMP ( most recent) CMP     Component Value Date/Time   NA 131 (L) 07/05/2019 0555   K 4.2 07/05/2019 0555   CL 101 07/05/2019 0555   CO2 22 07/05/2019 0555   GLUCOSE 93 07/05/2019 0555   BUN 18 07/05/2019 0555   CREATININE 1.43 (H) 07/05/2019 0555   CALCIUM 9.0 07/05/2019 0555   PROT 8.0 07/03/2019 2045   ALBUMIN 3.9 07/03/2019 2045   AST 18 07/03/2019 2045   ALT 11 07/03/2019 2045   ALKPHOS 60 07/03/2019 2045   BILITOT 1.1 07/03/2019 2045   GFRNONAA 47 (L) 07/05/2019 0555   GFRAA 54 (L) 07/05/2019 0555       Lab Results  Component Value Date   TSH 1.182 03/17/2017   TSH 1.411 03/16/2017            Assessment & Plan:   1. Hypercalcemia 2.  Prediabetes   3.  Hypocortisolemia - Anthony Burton  is being seen at a kind request of Fanta, Normajean Baxter, MD. Patient is not accompanied by his aides, not able to provide significant history. - I have reviewed his available  records and clinically evaluated the patient. - Based on these reviews, he has hypercalcemia without associated hyperparathyroidism, prediabetes, hypocortisolemia, hypervitaminosis D12.  -Some of his problems are associated with medications.  He is advised to discontinue vitamin B12, Megace, and calcium supplements  -  there is not sufficient information to proceed with definitive treatment plan. He will need repeat full work-up including:  PTH, intact and calcium - Magnesium -  Phosphorus - PTH-related peptide - Comprehensive metabolic panel -Repeat a.m. cortisol.  -For his prediabetes, I advised dietary adjustment with less consumption of processed carbs.  If he returns with continued hypocortisol anemia, he will be considered for ACTH stimulation test.  In light of low PTH of 7 associated with high calcium of 11.1, primary hyperparathyroidism seems to be unlikely.  He will have PTH related peptide to rule out malignancy related hypercalcemia.  Immobilization hypercalcemia is not ruled out. -After his subsequent labs, he will be considered for bone density as well. -He is not a suitable surgical candidate even if he is confirmed to have primary hyperparathyroidism.  He will be considered for low-dose Sensipar therapy or bisphosphonates if he continues to have hypercalcemia. - I did not initiate any new prescriptions today.  Patient with multiple medical problems including cognitive deficit, history of alcohol abuse, poor dentition.  He will benefit from evaluation by a dentist. - he is advised to maintain close follow up with Anthony Meiers, MD for primary care needs.   - Time spent with the  patient: 62 minutes, of which >50% was spent in  counseling him about his hypocortisolemia, prediabetes, hypercalcemia and the rest in obtaining information about his symptoms, reviewing his previous labs/studies ( including abstractions from other facilities),  evaluations, and treatments,  and developing a plan to confirm diagnosis and long term treatment based on the latest standards of care/guidelines; and documenting his care.  Anthony Burton participated in the discussions, expressed understanding, and voiced agreement with the above plans.  All questions were answered to his satisfaction. he is encouraged to contact clinic should he have any questions or concerns prior to his return visit.  Follow up plan: Return in about 3 weeks (around 08/08/2021) for Fasting Labs  in AM B4 8.   Glade Lloyd, MD [facility] [address] [place], [place] [address] Phone: [phone]  Fax: [phone]     07/18/2021, 4:54 PM  This note was partially dictated with voice recognition software. Similar sounding words can be transcribed inadequately or may not  be corrected upon review.

## 2021-08-16 ENCOUNTER — Ambulatory Visit: Payer: Medicare Other | Admitting: "Endocrinology

## 2021-08-16 ENCOUNTER — Other Ambulatory Visit: Payer: Self-pay

## 2021-08-16 ENCOUNTER — Encounter (HOSPITAL_COMMUNITY): Payer: Self-pay

## 2021-08-16 ENCOUNTER — Emergency Department (HOSPITAL_COMMUNITY)
Admission: EM | Admit: 2021-08-16 | Discharge: 2021-08-17 | Disposition: A | Discharge status: Skilled Nursing Facility | Payer: Medicare Other | Attending: Emergency Medicine | Admitting: Emergency Medicine

## 2021-08-16 DIAGNOSIS — D72829 Elevated white blood cell count, unspecified: Secondary | ICD-10-CM | POA: Diagnosis not present

## 2021-08-16 DIAGNOSIS — R0602 Shortness of breath: Secondary | ICD-10-CM | POA: Diagnosis present

## 2021-08-16 DIAGNOSIS — R0600 Dyspnea, unspecified: Secondary | ICD-10-CM

## 2021-08-16 NOTE — ED Triage Notes (Signed)
Pt to ED via [facility] for SOB, pt 95-98% on room air, per EMS, upon arrival pt had audible wheezing diffusely- pt was given 2.5 albuterol neb x 2 in route. Pt has no audible wheezing at this time- pt says he is no longer sob after receiving neb tx.

## 2021-08-17 ENCOUNTER — Emergency Department (HOSPITAL_COMMUNITY): Payer: Medicare Other

## 2021-08-17 DIAGNOSIS — R0602 Shortness of breath: Secondary | ICD-10-CM | POA: Diagnosis not present

## 2021-08-17 LAB — BASIC METABOLIC PANEL
Anion gap: 9 (ref 5–15)
BUN: 22 mg/dL (ref 8–23)
CO2: 19 mmol/L — ABNORMAL LOW (ref 22–32)
Calcium: 8.8 mg/dL — ABNORMAL LOW (ref 8.9–10.3)
Chloride: 96 mmol/L — ABNORMAL LOW (ref 98–111)
Creatinine, Ser: 2.15 mg/dL — ABNORMAL HIGH (ref 0.61–1.24)
GFR, Estimated: 31 mL/min — ABNORMAL LOW (ref >60)
Glucose, Bld: 130 mg/dL — ABNORMAL HIGH (ref 70–99)
Potassium: 4 mmol/L (ref 3.5–5.1)
Sodium: 124 mmol/L — ABNORMAL LOW (ref 135–145)

## 2021-08-17 LAB — CBC WITH DIFFERENTIAL/PLATELET
Abs Immature Granulocytes: 0.21 10*3/uL — ABNORMAL HIGH (ref 0.00–0.07)
Basophils Absolute: 0.1 10*3/uL (ref 0.0–0.1)
Basophils Relative: 0 %
Eosinophils Absolute: 0.2 10*3/uL (ref 0.0–0.5)
Eosinophils Relative: 2 %
HCT: 44.1 % (ref 39.0–52.0)
Hemoglobin: 14.2 g/dL (ref 13.0–17.0)
Immature Granulocytes: 1 %
Lymphocytes Relative: 4 %
Lymphs Abs: 0.6 10*3/uL — ABNORMAL LOW (ref 0.7–4.0)
MCH: 25.7 pg — ABNORMAL LOW (ref 26.0–34.0)
MCHC: 32.2 g/dL (ref 30.0–36.0)
MCV: 79.7 fL — ABNORMAL LOW (ref 80.0–100.0)
Monocytes Absolute: 0.2 10*3/uL (ref 0.1–1.0)
Monocytes Relative: 2 %
Neutro Abs: 13.3 10*3/uL — ABNORMAL HIGH (ref 1.7–7.7)
Neutrophils Relative %: 91 %
Platelets: 277 10*3/uL (ref 150–400)
RBC: 5.53 MIL/uL (ref 4.22–5.81)
RDW: 16.9 % — ABNORMAL HIGH (ref 11.5–15.5)
WBC: 14.6 10*3/uL — ABNORMAL HIGH (ref 4.0–10.5)
nRBC: 0 % (ref 0.0–0.2)

## 2021-08-17 LAB — BRAIN NATRIURETIC PEPTIDE: B Natriuretic Peptide: 55 pg/mL (ref 0.0–100.0)

## 2021-08-17 MED ORDER — SODIUM CHLORIDE 0.9 % IV BOLUS
500.0000 mL | Freq: Once | INTRAVENOUS | Status: AC
  Administered 2021-08-17: 500 mL via INTRAVENOUS

## 2021-08-17 MED ORDER — ALBUTEROL SULFATE HFA 108 (90 BASE) MCG/ACT IN AERS
2.0000 | INHALATION_SPRAY | Freq: Once | RESPIRATORY_TRACT | Status: AC
  Administered 2021-08-17: 2 via RESPIRATORY_TRACT
  Filled 2021-08-17: qty 6.7

## 2021-08-17 NOTE — ED Notes (Signed)
Lab and xray at bedside.

## 2021-08-17 NOTE — ED Notes (Signed)
This RN attempted to give report to [facility] [phone]). No answer and voicemail is full

## 2021-08-17 NOTE — ED Provider Notes (Signed)
[facility] EMERGENCY DEPARTMENT Provider Note   CSN: [id number] Arrival date & time: 08/16/21  2321     History  Chief Complaint  Patient presents with   Shortness of Breath    Anthony Burton is a 81 y.o. male.  Patient is a 81 year old male with past medical history of renal insufficiency, prediabetes, and bilateral subdural hematomas.  Patient brought by EMS from his extended care facility for evaluation of shortness of breath.  From what I am told, he began wheezing this evening and complaining of shortness of breath.  EMS was called.  He was given a breathing treatment by EMS and now his symptoms have resolved.  He denies fevers, chills, or productive cough.  He denies any chest pain or leg swelling.  Patient does report continued use of cigarettes, but denies history of COPD or emphysema.  The history is provided by the patient.  Shortness of Breath Severity:  Moderate Onset quality:  Sudden Duration:  2 hours Timing:  Constant Progression:  Resolved Chronicity:  New      Home Medications Prior to Admission medications   Medication Sig Start Date End Date Taking? Authorizing Provider  chlorhexidine (PERIDEX) 0.12 % solution 15 mLs by Mouth Rinse route 2 (two) times daily. 03/20/17   Shahmehdi, Valeria Batman, MD  folic acid (FOLVITE) 1 MG tablet Take 1 tablet (1 mg total) by mouth daily. 03/21/17   Shahmehdi, Valeria Batman, MD  metoprolol tartrate (LOPRESSOR) 25 MG tablet Take 25 mg by mouth 2 (two) times daily. 06/15/19   [provider]  Multiple Vitamin (MULTIVITAMIN WITH MINERALS) TABS tablet Take 1 tablet by mouth daily. 03/21/17   Shahmehdi, Valeria Batman, MD  thiamine 100 MG tablet Take 1 tablet (100 mg total) by mouth daily. 03/21/17   Deatra James, MD      Allergies    Patient has no known allergies.    Review of Systems   Review of Systems  Respiratory:  Positive for shortness of breath.   All other systems reviewed and are negative.   Physical Exam Updated Vital  Signs BP 97/63   Pulse 92   Temp 97.7 F (36.5 C) (Oral)   Resp 19   Ht 5\' 9"  (1.753 m)   Wt 69.1 kg   SpO2 96%   BMI 22.50 kg/m  Physical Exam Vitals and nursing note reviewed.  Constitutional:      General: He is not in acute distress.    Appearance: He is well-developed. He is not diaphoretic.  HENT:     Head: Normocephalic and atraumatic.  Cardiovascular:     Rate and Rhythm: Normal rate and regular rhythm.     Heart sounds: No murmur heard.    No friction rub.  Pulmonary:     Effort: Pulmonary effort is normal. No respiratory distress.     Breath sounds: Normal breath sounds. No wheezing or rales.  Abdominal:     General: Bowel sounds are normal. There is no distension.     Palpations: Abdomen is soft.     Tenderness: There is no abdominal tenderness.  Musculoskeletal:        General: Normal range of motion.     Cervical back: Normal range of motion and neck supple.     Right lower leg: No tenderness. No edema.     Left lower leg: No tenderness. No edema.  Skin:    General: Skin is warm and dry.  Neurological:     Mental Status:  He is alert and oriented to person, place, and time.     Coordination: Coordination normal.     ED Results / Procedures / Treatments   Labs (all labs ordered are listed, but only abnormal results are displayed) Labs Reviewed  BRAIN NATRIURETIC PEPTIDE  CBC WITH DIFFERENTIAL/PLATELET  BASIC METABOLIC PANEL    EKG EKG Interpretation  Date/Time:  Friday August 17 2021 00:56:24 EDT Ventricular Rate:  93 PR Interval:  170 QRS Duration: 81 QT Interval:  342 QTC Calculation: 426 R Axis:   42 Text Interpretation: Sinus rhythm Probable anteroseptal infarct, old Confirmed by Veryl Speak [phone]) on 08/17/2021 1:32:27 AM  Radiology No results found.  Procedures Procedures    Medications Ordered in ED Medications - No data to display  ED Course/ Medical Decision Making/ A&P  This patient presents to the ED for concern of  shortness of breath, this involves an extensive number of treatment options, and is a complaint that carries with it a high risk of complications and morbidity.  The differential diagnosis includes COPD, asthma, pulmonary embolism, pneumonia   Co morbidities that complicate the patient evaluation  None   Additional history obtained:  No additional history or external records needed   Lab Tests:  I Ordered, and personally interpreted labs.  The pertinent results include: CBC showing a leukocytosis with white count of 67.6, and metabolic panel showing a sodium of 124 and a mild bump of his creatinine of 2.1.   Imaging Studies ordered:  I ordered imaging studies including chest x-ray I independently visualized and interpreted imaging which showed no acute process I agree with the radiologist interpretation   Cardiac Monitoring: / EKG:  The patient was maintained on a cardiac monitor.  I personally viewed and interpreted the cardiac monitored which showed an underlying rhythm of: Sinus   Consultations Obtained:  No consultation obtained   Problem List / ED Course / Critical interventions / Medication management  Patient sent for shortness of breath from his extended care facility.  He was given a nebulizer treatment in route and symptoms have resolved.  He is now feeling better.  Laboratory studies show hyponatremia and a mild bump in his creatinine above baseline.  Patient was given normal saline for this, but I believe can be safely discharged.  Oxygen saturations are in the upper 90s on room air and patient states that he feels fine.  He is to follow-up as needed if not improving. I have reviewed the patients home medicines and have made adjustments as needed   Social Determinants of Health:  None   Test / Admission - Considered:  Patient to be discharged with an albuterol inhaler and as needed return.   Final Clinical Impression(s) / ED Diagnoses Final diagnoses:   None    Rx / DC Orders ED Discharge Orders     None         Veryl Speak, MD 08/17/21 [phone]

## 2021-08-17 NOTE — Discharge Instructions (Signed)
Begin using the albuterol inhaler, 2 puffs every 4 hours as needed for wheezing/difficulty breathing.  Refrain from cigarette smoking.    Follow-up with primary doctor in the next 1 to 2 weeks, and return to the ER if symptoms significantly worsen or change.

## 2021-08-21 ENCOUNTER — Other Ambulatory Visit (INDEPENDENT_AMBULATORY_CARE_PROVIDER_SITE_OTHER): Payer: Self-pay

## 2021-08-21 ENCOUNTER — Encounter (INDEPENDENT_AMBULATORY_CARE_PROVIDER_SITE_OTHER): Payer: Self-pay

## 2021-08-21 ENCOUNTER — Ambulatory Visit (INDEPENDENT_AMBULATORY_CARE_PROVIDER_SITE_OTHER): Payer: Medicare Other | Admitting: Gastroenterology

## 2021-08-21 ENCOUNTER — Encounter (INDEPENDENT_AMBULATORY_CARE_PROVIDER_SITE_OTHER): Payer: Self-pay | Admitting: Gastroenterology

## 2021-08-21 ENCOUNTER — Telehealth (INDEPENDENT_AMBULATORY_CARE_PROVIDER_SITE_OTHER): Payer: Self-pay

## 2021-08-21 VITALS — BP 155/92 | HR 108 | Temp 97.6°F | Ht 69.0 in | Wt 152.7 lb

## 2021-08-21 DIAGNOSIS — D509 Iron deficiency anemia, unspecified: Secondary | ICD-10-CM | POA: Diagnosis not present

## 2021-08-21 MED ORDER — PEG 3350-KCL-NA BICARB-NACL 420 G PO SOLR: 4000.0000 mL | ORAL | 0 refills | Status: DC

## 2021-08-21 NOTE — Patient Instructions (Addendum)
We will get you scheduled for EGD and colonoscopy given your low iron As you had iron infusion in April per records from kidney doctor, we will hold off on oral iron for now as last iron studies were almost normal. Please make me aware if you have worsening shortness of breath, dizziness, you pass out or have rectal bleeding or black stools  Please avoid NSAIDs (advil, aleve, naproxen, goody powder, ibuprofen) as these can be very hard on your GI tract, causing inflammation, ulcers and damage to the lining of your GI tract.

## 2021-08-21 NOTE — Progress Notes (Signed)
Referring Provider: Carrolyn Meiers* Primary Care Physician:  Carrolyn Meiers, MD Primary GI Physician: new   Chief Complaint  Patient presents with   Anemia    Patient referred for IDA. States he has sob at times. Has not seen any blood in stool.    HPI:   Anthony Burton is a 81 y.o. male with past medical history of gout, kidney disease 3b  Patient presenting today as a new patient for IDA.  Hgb appears to have been slightly low over the past few months with lower iron since march 2023 March iron 29, saturation 11, TIBC 270 May iron 27, iron 14, TIBC 192, saturation 14 June Iron 54, TIBC 220, saturation 25  Ferritin 515- 809 Hgb range 10.5-14.2 since March  Patient did receive IV iron in April, no current Iron therapy.  History somewhat difficult to obtain from patient and caregiver with him. Caregiver reports that patient was sent to ED recently for not feeling well, he had SOB. Caregiver reports that he c/o chest pain and feeling like he was going to pass out. Workup was unremarkable at that time. He was discharged shortly after. Hgb at that time was 14.2 with MCV of 79.7.  Patient reports he is doing well. Denies dizziness or fatigue. No rectal bleeding or melena. Usually has a BM maybe twice per day, denies constipation or diarrhea. He denies any abdominal pain. No nausea or vomiting. States that he is eating well. Feels like appetite is good. Caregiver reports he has some ongoing lightheadedness and SOB.   NSAID use: ibuprofen PRN Social hx:no alcohol, smokes 3-4 cigarettes per day   Fam hx: no liver disease or CRC  Last Colonoscopy:never Last Endoscopy:never  Recommendations:    Past Medical History:  Diagnosis Date   Gout    Kidney disease     No past surgical history on file.  Current Outpatient Medications  Medication Sig Dispense Refill   chlorhexidine (PERIDEX) 0.12 % solution 15 mLs by Mouth Rinse route 2 (two) times daily. 893 mL 0    folic acid (FOLVITE) 1 MG tablet Take 1 tablet (1 mg total) by mouth daily. 5 tablet 0   ibuprofen (ADVIL) 800 MG tablet Take 800 mg by mouth every 8 (eight) hours as needed.     metoprolol tartrate (LOPRESSOR) 25 MG tablet Take 25 mg by mouth 2 (two) times daily.     thiamine 100 MG tablet Take 1 tablet (100 mg total) by mouth daily. 5 tablet 0   No current facility-administered medications for this visit.    Allergies as of 08/21/2021   (No Known Allergies)    No family history on file.  Social History   Socioeconomic History   Marital status: Married    Spouse name: Not on file   Number of children: Not on file   Years of education: Not on file   Highest education level: Not on file  Occupational History   Not on file  Tobacco Use   Smoking status: Every Day    Types: Cigarettes    Passive exposure: Current   Smokeless tobacco: Never  Vaping Use   Vaping Use: Never used  Substance and Sexual Activity   Alcohol use: Not Currently    Comment: daily use   Drug use: No   Sexual activity: Not on file  Other Topics Concern   Not on file  Social History Narrative   Not on file   Social Determinants of Health  Financial Resource Strain: Not on file  Food Insecurity: Not on file  Transportation Needs: Not on file  Physical Activity: Not on file  Stress: Not on file  Social Connections: Not on file   Review of systems General: negative for malaise, night sweats, fever, chills, weight loss Neck: Negative for lumps, goiter, pain and significant neck swelling Resp: Negative for cough, wheezing, dyspnea at rest CV: Negative for chest pain, leg swelling, palpitations, orthopnea +SOB +lightheadedness GI: denies melena, hematochezia, nausea, vomiting, diarrhea, constipation, dysphagia, odyonophagia, early satiety or unintentional weight loss.  MSK: Negative for joint pain or swelling, back pain, and muscle pain. Derm: Negative for itching or rash Psych: Denies depression,  anxiety, memory loss, confusion. No homicidal or suicidal ideation.  Heme: Negative for prolonged bleeding, bruising easily, and swollen nodes. Endocrine: Negative for cold or heat intolerance, polyuria, polydipsia and goiter. Neuro: negative for tremor, gait imbalance, syncope and seizures. The remainder of the review of systems is noncontributory.  Physical Exam: BP (!) 155/92 (BP Location: Left Arm, Patient Position: Sitting, Cuff Size: Large)   Pulse (!) 108   Temp 97.6 F (36.4 C) (Oral)   Ht 5\' 9"  (1.753 m)   Wt 152 lb 11.2 oz (69.3 kg)   BMI 22.55 kg/m  General:   Alert and oriented. No distress noted. Pleasant and cooperative.  Head:  Normocephalic and atraumatic. Eyes:  Conjuctiva clear without scleral icterus. Mouth:  Oral mucosa pink and moist. Good dentition. No lesions. Heart: Normal rate and rhythm, s1 and s2 heart sounds present.  Lungs: Clear lung sounds in all lobes. Respirations equal and unlabored. Abdomen:  +BS, soft, non-tender and non-distended. No rebound or guarding. No HSM or masses noted. Derm: No palmar erythema or jaundice Msk:  Symmetrical without gross deformities. Normal posture. Extremities:  Without edema. Neurologic:  Alert and  oriented x4 Psych:  Alert and cooperative. Normal mood and affect.  Invalid input(s): "6 MONTHS"   ASSESSMENT: [name] is a 81 y.o. male presenting today as a new patient for IDA.  Low iron saturation since march, WNL in June after iron infusion in April. He denies any rectal bleeding or melena. Patient reports he feels fine, though caregiver states he has had some SOB and lighteadedness. He denies weight loss, changes in appetite, nausea, vomiting, or changes in bowel habits, he has never had a colonoscopy before.Recommend proceeding with EGD and Colonoscopy to rule out GI sources of blood loss contributing to IDA. Patient hesitant to proceed with these procedures, we discussed reasons for further evaluation of his  IDA and potential causes to include PUD, bleeding polyps, AVMs or malignancy. Indications, risks and benefits of procedure discussed in detail with patient. Patient and caregiver verbalized understanding and are in agreement to proceed with EGD/Colonoscopy at this time. Given recent iron infusion and most recent iron studies being WNL, will hold off on supplemental iron at this time, he should make me aware of worsening SOB, Dizziness, syncope, rectal bleeding or melena.   PLAN:  EGD/Colonoscopy, ASA III, ENDO 3 2. Pt to make me aware of worsening SOB, dizziness, syncope, rectal bleeding or melena  3. Avoid NSAIDs   All questions were answered, patient verbalized understanding and is in agreement with plan as outlined above.    Follow Up: TBD  Mckenleigh Tarlton L. Alver Sorrow, MSN, APRN, AGNP-C Adult-Gerontology Nurse Practitioner [facility]

## 2021-08-21 NOTE — Telephone Encounter (Signed)
Anthony Burton Ann Neal Trulson, CMA  ?

## 2021-08-22 ENCOUNTER — Encounter (INDEPENDENT_AMBULATORY_CARE_PROVIDER_SITE_OTHER): Payer: Self-pay

## 2021-08-24 LAB — COMPREHENSIVE METABOLIC PANEL
ALT: 8 IU/L (ref 0–44)
AST: 14 IU/L (ref 0–40)
Albumin/Globulin Ratio: 0.8 — ABNORMAL LOW (ref 1.2–2.2)
Albumin: 3.8 g/dL (ref 3.8–4.8)
Alkaline Phosphatase: 100 IU/L (ref 44–121)
BUN/Creatinine Ratio: 8 — ABNORMAL LOW (ref 10–24)
BUN: 14 mg/dL (ref 8–27)
Bilirubin Total: 0.4 mg/dL (ref 0.0–1.2)
CO2: 23 mmol/L (ref 20–29)
Calcium: 10.1 mg/dL (ref 8.6–10.2)
Chloride: 95 mmol/L — ABNORMAL LOW (ref 96–106)
Creatinine, Ser: 1.82 mg/dL — ABNORMAL HIGH (ref 0.76–1.27)
Globulin, Total: 4.8 g/dL — ABNORMAL HIGH (ref 1.5–4.5)
Glucose: 79 mg/dL (ref 70–99)
Potassium: 4.9 mmol/L (ref 3.5–5.2)
Sodium: 130 mmol/L — ABNORMAL LOW (ref 134–144)
Total Protein: 8.6 g/dL — ABNORMAL HIGH (ref 6.0–8.5)
eGFR: 37 mL/min/{1.73_m2} — ABNORMAL LOW (ref >59)

## 2021-08-24 LAB — PTH, INTACT AND CALCIUM: PTH: 9 pg/mL — ABNORMAL LOW (ref 15–65)

## 2021-08-24 LAB — PTH-RELATED PEPTIDE: PTH-related peptide: <2 pmol/L

## 2021-08-24 LAB — PHOSPHORUS: Phosphorus: 3.3 mg/dL (ref 2.8–4.1)

## 2021-08-24 LAB — MAGNESIUM: Magnesium: 1.6 mg/dL (ref 1.6–2.3)

## 2021-08-24 LAB — CORTISOL-AM, BLOOD: Cortisol - AM: 14.1 ug/dL (ref 6.2–19.4)

## 2021-09-04 ENCOUNTER — Telehealth (INDEPENDENT_AMBULATORY_CARE_PROVIDER_SITE_OTHER): Payer: Self-pay

## 2021-09-04 MED ORDER — PEG 3350-KCL-NA BICARB-NACL 420 G PO SOLR: 4000.0000 mL | ORAL | 0 refills | Status: DC

## 2021-09-04 NOTE — Telephone Encounter (Signed)
Mylin Hirano Ann Aishi Courts, CMA  ?

## 2021-09-06 ENCOUNTER — Ambulatory Visit (INDEPENDENT_AMBULATORY_CARE_PROVIDER_SITE_OTHER): Payer: Medicare Other | Admitting: "Endocrinology

## 2021-09-06 ENCOUNTER — Encounter: Payer: Self-pay | Admitting: "Endocrinology

## 2021-09-06 DIAGNOSIS — R7303 Prediabetes: Secondary | ICD-10-CM

## 2021-09-06 DIAGNOSIS — E2749 Other adrenocortical insufficiency: Secondary | ICD-10-CM

## 2021-09-06 NOTE — Progress Notes (Signed)
09/06/2021, 3:05 PM   Endocrinology follow-up note  Subjective:    Patient ID: Anthony Burton, male    DOB: Feb 02, 1941, PCP Anthony Meiers, MD   Past Medical History:  Diagnosis Date   Alcohol dependence ([place])    Gout    Hypertension    Kidney disease    History reviewed. No pertinent surgical history. Social History   Socioeconomic History   Marital status: Married    Spouse name: Not on file   Number of children: Not on file   Years of education: Not on file   Highest education level: Not on file  Occupational History   Not on file  Tobacco Use   Smoking status: Every Day    Types: Cigarettes    Passive exposure: Current   Smokeless tobacco: Never  Vaping Use   Vaping Use: Never used  Substance and Sexual Activity   Alcohol use: Not Currently    Comment: daily use   Drug use: No   Sexual activity: Not on file  Other Topics Concern   Not on file  Social History Narrative   Not on file   Social Determinants of Health   Financial Resource Strain: Not on file  Food Insecurity: Not on file  Transportation Needs: Not on file  Physical Activity: Not on file  Stress: Not on file  Social Connections: Not on file   History reviewed. No pertinent family history. Outpatient Encounter Medications as of 09/06/2021  Medication Sig   chlorhexidine (PERIDEX) 0.12 % solution 15 mLs by Mouth Rinse route 2 (two) times daily.   folic acid (FOLVITE) 1 MG tablet Take 1 tablet (1 mg total) by mouth daily.   ibuprofen (ADVIL) 800 MG tablet Take 800 mg by mouth every 8 (eight) hours as needed.   metoprolol tartrate (LOPRESSOR) 25 MG tablet Take 25 mg by mouth 2 (two) times daily.   polyethylene glycol-electrolytes (TRILYTE) 420 g solution Take 4,000 mLs by mouth as directed.   thiamine 100 MG tablet Take 1 tablet (100 mg total) by mouth daily.   No facility-administered encounter medications on file as of  09/06/2021.   ALLERGIES: No Known Allergies  VACCINATION STATUS: Immunization History  Administered Date(s) Administered   Influenza, High Dose Seasonal PF 03/18/2017   Pneumococcal Polysaccharide-23 03/18/2017   Tdap 03/16/2015    HPI Anthony Burton is 81 y.o. male who presents today with a medical history as above. He is being seen in follow-up.  He is alone today also.  He was brought by his driver from [facility] assisted living.    His consult was for hypercalcemia, prediabetes, hypocortisolemia.  His repeat labs are revised and showing normal calcium , normal cortisol.  He feels better, has no new complaints today. He is a suboptimal historian. He is at assisted living due to multiple medical problems including ataxia, hypertension, tachyarrhythmia, gouty arthritis, history of alcohol dependence, protein calorie malnutrition, history of falls. His medication list includes Megace, vitamin B12, calcium/vitamin D, thiamine, metoprolol. Review of his medical records shows supraphysiologic levels of vitamin B12, hypercalcemia, hypocortisolemia, and prediabetes with A1c of 6%. Labs from outside facility showed most recent calcium of 11.1, PTH of 7.  Renal  insufficiency with serum creatinine of 2.3 and estimated GFR of 28. Repeat labs from August 17, 2021 show calcium 10.1, PTH 9, cortisol 14.1.  He denies pain, nausea/vomiting. He relates with a walker.     Review of Systems  Constitutional: + Fluctuating body weight, denies fatigue, no subjective hyperthermia, no subjective hypothermia Eyes: no blurry vision, no xerophthalmia ENT: no sore throat, no nodules palpated in throat, no dysphagia/odynophagia, no hoarseness Cardiovascular: no Chest Pain, no Shortness of Breath, no palpitations, no leg swelling Respiratory: no cough, no shortness of breath Gastrointestinal: no Nausea/Vomiting/Diarhhea Musculoskeletal: no muscle/joint aches Skin: no rashes Neurological: no tremors, no  numbness, no tingling, no dizziness Psychiatric: no depression, no anxiety  Objective:       09/06/2021    1:59 PM 08/21/2021    1:39 PM 08/17/2021    5:30 AM  Vitals with BMI  Height 5\' 9"  5\' 9"    Weight 149 lbs 6 oz 152 lbs 11 oz   BMI 37.62 83.15   Systolic 176 160 737  Diastolic 66 92 78  Pulse 60 108 93    BP 124/66   Pulse 60   Ht 5\' 9"  (1.753 m)   Wt 149 lb 6.4 oz (67.8 kg)   BMI 22.06 kg/m   Wt Readings from Last 3 Encounters:  09/06/21 149 lb 6.4 oz (67.8 kg)  08/21/21 152 lb 11.2 oz (69.3 kg)  08/16/21 152 lb 5.4 oz (69.1 kg)    Physical Exam  Constitutional:  Body mass index is 22.06 kg/m.,  not in acute distress, normal state of mind, + cognitive deficit.  Chronically sick looking. Eyes: PERRLA, EOMI, no exophthalmos  CMP ( most recent) CMP     Component Value Date/Time   NA 130 (L) 08/20/2021 0850   K 4.9 08/20/2021 0850   CL 95 (L) 08/20/2021 0850   CO2 23 08/20/2021 0850   GLUCOSE 79 08/20/2021 0850   GLUCOSE 130 (H) 08/17/2021 0156   BUN 14 08/20/2021 0850   CREATININE 1.82 (H) 08/20/2021 0850   CALCIUM 10.1 08/20/2021 0850   PROT 8.6 (H) 08/20/2021 0850   ALBUMIN 3.8 08/20/2021 0850   AST 14 08/20/2021 0850   ALT 8 08/20/2021 0850   ALKPHOS 100 08/20/2021 0850   BILITOT 0.4 08/20/2021 0850   GFRNONAA 31 (L) 08/17/2021 0156   GFRAA 54 (L) 07/05/2019 0555       Lab Results  Component Value Date   TSH 1.182 03/17/2017   TSH 1.411 03/16/2017          Latest Reference Range & Units Most Recent  Cortisol - AM 6.2 - 19.4 ug/dL 14.1 08/20/21 08:50  Glucose 70 - 99 mg/dL 79 08/20/21 08:50  PTH, Intact 15 - 65 pg/mL 9 (L) 08/20/21 08:50  PTH-related peptide pmol/L <2.0 08/20/21 08:50  PTH Interp  Comment 08/20/21 08:50  (L): Data is abnormally low  Assessment & Plan:   1. Hypercalcemia-resolved 2.  Prediabetes -stable 3.  Hypocortisolemia-resolved I discussed his new results with him.  He does not have hypocalcemia nor adrenal  insufficiency. His PTH is low, will need follow-up. -Some of his problems are associated with medications.  He is advised to discontinue vitamin B12, Megace, and calcium supplements He will not need any intervention for calcium nor prediabetes.  -For his prediabetes, I advised dietary adjustment with less consumption of processed carbs. In light of low PTH at 9 associated with normal calcium of 10.1  primary hyperparathyroidism seems to be unlikely.  His  PTH related peptide is undetectable.     Immobilization hypercalcemia is not ruled out. -After his subsequent labs, he will be considered for bone density as well. -He is not a suitable surgical candidate even if he is confirmed to have primary hyperparathyroidism.  He will be considered for low-dose Sensipar therapy or bisphosphonates if he continues to have hypercalcemia. - I did not initiate any new prescriptions today.  Patient with multiple medical problems including cognitive deficit, history of alcohol abuse, poor dentition.  He will benefit from evaluation by a dentist. - he is advised to maintain close follow up with Anthony Meiers, MD for primary care needs.   I spent 22 minutes in the care of the patient today including review of labs from Thyroid Function, CMP, and other relevant labs ; imaging/biopsy records (current and previous including abstractions from other facilities); face-to-face time discussing  his lab results and symptoms, medications doses, his options of short and long term treatment based on the latest standards of care / guidelines;   and documenting the encounter.  Anthony Burton  participated in the discussions, expressed understanding, and voiced agreement with the above plans.  All questions were answered to his satisfaction. he is encouraged to contact clinic should he have any questions or concerns prior to his return visit.   Follow up plan: Return in about 4 months (around 01/07/2022) for F/U with  Pre-visit Labs.   Glade Lloyd, MD [facility] [address] [place], [place] [address] Phone: [phone]  Fax: [phone]     09/06/2021, 3:05 PM  This note was partially dictated with voice recognition software. Similar sounding words can be transcribed inadequately or may not  be corrected upon review.

## 2021-09-26 NOTE — Progress Notes (Signed)
Reviewed pre procedure prep instructions via phone with Anthony Burton at [facility] home.  She states Mr. Dicaprio is able to sign his consent

## 2021-09-27 ENCOUNTER — Encounter (HOSPITAL_COMMUNITY)
Admission: RE | Admit: 2021-09-27 | Discharge: 2021-09-27 | Disposition: A | Discharge status: Home / Self Care | Payer: Medicare Other | Source: Ambulatory Visit | Attending: Gastroenterology | Admitting: Gastroenterology

## 2021-10-02 ENCOUNTER — Ambulatory Visit (HOSPITAL_COMMUNITY): Payer: Medicare Other | Admitting: Anesthesiology

## 2021-10-02 ENCOUNTER — Encounter (HOSPITAL_COMMUNITY)
Admission: RE | Disposition: A | Discharge status: Home / Self Care | Payer: Self-pay | Source: Home / Self Care | Attending: Gastroenterology

## 2021-10-02 ENCOUNTER — Ambulatory Visit (HOSPITAL_BASED_OUTPATIENT_CLINIC_OR_DEPARTMENT_OTHER): Payer: Medicare Other | Admitting: Anesthesiology

## 2021-10-02 ENCOUNTER — Encounter (HOSPITAL_COMMUNITY): Payer: Self-pay | Admitting: Gastroenterology

## 2021-10-02 ENCOUNTER — Ambulatory Visit (HOSPITAL_COMMUNITY)
Admission: RE | Admit: 2021-10-02 | Discharge: 2021-10-02 | Disposition: A | Discharge status: Home / Self Care | Payer: Medicare Other | Attending: Gastroenterology | Admitting: Gastroenterology

## 2021-10-02 ENCOUNTER — Other Ambulatory Visit: Payer: Self-pay

## 2021-10-02 DIAGNOSIS — F17210 Nicotine dependence, cigarettes, uncomplicated: Secondary | ICD-10-CM | POA: Diagnosis not present

## 2021-10-02 DIAGNOSIS — M109 Gout, unspecified: Secondary | ICD-10-CM | POA: Diagnosis not present

## 2021-10-02 DIAGNOSIS — I129 Hypertensive chronic kidney disease with stage 1 through stage 4 chronic kidney disease, or unspecified chronic kidney disease: Secondary | ICD-10-CM | POA: Diagnosis not present

## 2021-10-02 DIAGNOSIS — D509 Iron deficiency anemia, unspecified: Secondary | ICD-10-CM | POA: Diagnosis not present

## 2021-10-02 DIAGNOSIS — K2980 Duodenitis without bleeding: Secondary | ICD-10-CM | POA: Diagnosis not present

## 2021-10-02 DIAGNOSIS — D125 Benign neoplasm of sigmoid colon: Secondary | ICD-10-CM | POA: Insufficient documentation

## 2021-10-02 DIAGNOSIS — K449 Diaphragmatic hernia without obstruction or gangrene: Secondary | ICD-10-CM

## 2021-10-02 DIAGNOSIS — D123 Benign neoplasm of transverse colon: Secondary | ICD-10-CM | POA: Insufficient documentation

## 2021-10-02 DIAGNOSIS — D124 Benign neoplasm of descending colon: Secondary | ICD-10-CM | POA: Insufficient documentation

## 2021-10-02 DIAGNOSIS — N189 Chronic kidney disease, unspecified: Secondary | ICD-10-CM | POA: Diagnosis not present

## 2021-10-02 DIAGNOSIS — K635 Polyp of colon: Secondary | ICD-10-CM | POA: Diagnosis not present

## 2021-10-02 DIAGNOSIS — D122 Benign neoplasm of ascending colon: Secondary | ICD-10-CM | POA: Insufficient documentation

## 2021-10-02 HISTORY — PX: POLYPECTOMY: SHX5525

## 2021-10-02 HISTORY — PX: ESOPHAGOGASTRODUODENOSCOPY (EGD) WITH PROPOFOL: SHX5813

## 2021-10-02 HISTORY — PX: COLONOSCOPY WITH PROPOFOL: SHX5780

## 2021-10-02 HISTORY — PX: BIOPSY: SHX5522

## 2021-10-02 LAB — HM COLONOSCOPY

## 2021-10-02 SURGERY — COLONOSCOPY WITH PROPOFOL
Anesthesia: General

## 2021-10-02 MED ORDER — LACTATED RINGERS IV SOLN: INTRAVENOUS | Status: DC | PRN

## 2021-10-02 MED ORDER — PHENYLEPHRINE 80 MCG/ML (10ML) SYRINGE FOR IV PUSH (FOR BLOOD PRESSURE SUPPORT)
PREFILLED_SYRINGE | INTRAVENOUS | Status: AC
  Filled 2021-10-02: qty 20

## 2021-10-02 MED ORDER — PHENYLEPHRINE HCL (PRESSORS) 10 MG/ML IV SOLN
INTRAVENOUS | Status: DC | PRN
  Administered 2021-10-02: 80 ug via INTRAVENOUS
  Administered 2021-10-02 (×2): 160 ug via INTRAVENOUS

## 2021-10-02 MED ORDER — PROPOFOL 10 MG/ML IV BOLUS
INTRAVENOUS | Status: DC | PRN
  Administered 2021-10-02: 50 mg via INTRAVENOUS

## 2021-10-02 MED ORDER — PROPOFOL 1000 MG/100ML IV EMUL
INTRAVENOUS | Status: AC
  Filled 2021-10-02: qty 100

## 2021-10-02 MED ORDER — LIDOCAINE HCL (CARDIAC) PF 100 MG/5ML IV SOSY
PREFILLED_SYRINGE | INTRAVENOUS | Status: DC | PRN
  Administered 2021-10-02: 50 mg via INTRAVENOUS

## 2021-10-02 MED ORDER — OMEPRAZOLE 40 MG PO CPDR: 40.0000 mg | DELAYED_RELEASE_CAPSULE | Freq: Every day | ORAL | 3 refills | Status: DC

## 2021-10-02 MED ORDER — PROPOFOL 500 MG/50ML IV EMUL
INTRAVENOUS | Status: DC | PRN
  Administered 2021-10-02: 150 ug/kg/min via INTRAVENOUS

## 2021-10-02 NOTE — Anesthesia Preprocedure Evaluation (Signed)
Anesthesia Evaluation  Patient identified by MRN, date of birth, ID band Patient awake    Reviewed: Allergy & Precautions, H&P , NPO status , Patient's Chart, lab work & pertinent test results, reviewed documented beta blocker date and time   Airway Mallampati: II  TM Distance: >3 FB Neck ROM: full    Dental no notable dental hx.    Pulmonary neg pulmonary ROS, Current Smoker,    Pulmonary exam normal breath sounds clear to auscultation       Cardiovascular Exercise Tolerance: Good hypertension, negative cardio ROS   Rhythm:regular Rate:Normal     Neuro/Psych negative neurological ROS  negative psych ROS   GI/Hepatic negative GI ROS, Neg liver ROS,   Endo/Other  negative endocrine ROS  Renal/GU negative Renal ROS  negative genitourinary   Musculoskeletal   Abdominal   Peds  Hematology  (+) Blood dyscrasia, anemia ,   Anesthesia Other Findings   Reproductive/Obstetrics negative OB ROS                             Anesthesia Physical Anesthesia Plan  ASA: 2  Anesthesia Plan: General   Post-op Pain Management:    Induction:   PONV Risk Score and Plan: Propofol infusion  Airway Management Planned:   Additional Equipment:   Intra-op Plan:   Post-operative Plan:   Informed Consent: I have reviewed the patients History and Physical, chart, labs and discussed the procedure including the risks, benefits and alternatives for the proposed anesthesia with the patient or authorized representative who has indicated his/her understanding and acceptance.     Dental Advisory Given  Plan Discussed with: CRNA  Anesthesia Plan Comments:         Anesthesia Quick Evaluation

## 2021-10-02 NOTE — Discharge Instructions (Addendum)
You are being discharged to home.  Resume your previous diet.  We are waiting for your pathology results.  Take Prilosec (omeprazole) 40 mg by mouth once a day.  Do not take any ibuprofen (including Advil, Motrin or Nuprin), naproxen, or other non-steroidal anti-inflammatory drugs.  ENT referral  Your physician has indicated that a repeat colonoscopy is not recommended due to your current age (84 years or older) for screening purposes.

## 2021-10-02 NOTE — H&P (Signed)
Anthony Burton is an 81 y.o. male.   Chief Complaint: iron deficiency anemia HPI: 81 year old male with past medical history of gout, hypertension, CKD, coming for evaluation of iron deficiency anemia  Patient reports feeling well denies any melena, hematochezia, nausea, vomiting, fever or chills.  He had reports that he has been taking oral iron although I do not see this in his chart.  Most recent hemoglobin was above 14.  Past Medical History:  Diagnosis Date   Alcohol dependence ([place])    Gout    Hypertension    Kidney disease     History reviewed. No pertinent surgical history.  History reviewed. No pertinent family history. Social History:  reports that he has been smoking cigarettes. He has been exposed to tobacco smoke. He has never used smokeless tobacco. He reports that he does not currently use alcohol. He reports that he does not use drugs.  Allergies: No Known Allergies  Medications Prior to Admission  Medication Sig Dispense Refill   folic acid (FOLVITE) 1 MG tablet Take 1 tablet (1 mg total) by mouth daily. 5 tablet 0   metoprolol tartrate (LOPRESSOR) 25 MG tablet Take 25 mg by mouth 2 (two) times daily.     polyethylene glycol-electrolytes (TRILYTE) 420 g solution Take 4,000 mLs by mouth as directed. 4000 mL 0   chlorhexidine (PERIDEX) 0.12 % solution 15 mLs by Mouth Rinse route 2 (two) times daily. 120 mL 0   ibuprofen (ADVIL) 800 MG tablet Take 800 mg by mouth every 8 (eight) hours as needed for moderate pain.     thiamine 100 MG tablet Take 1 tablet (100 mg total) by mouth daily. (Patient not taking: Reported on 09/25/2021) 5 tablet 0    No results found for this or any previous visit (from the past 48 hour(s)). No results found.  Review of Systems  All other systems reviewed and are negative.   Blood pressure 133/75, pulse 77, temperature 97.9 F (36.6 C), resp. rate 17, height 5\' 9"  (1.753 m), weight 67 kg, SpO2 100 %. Physical Exam  GENERAL: The patient is  AO x3, in no acute distress. HEENT: Head is normocephalic and atraumatic. EOMI are intact. Mouth is well hydrated and without lesions. NECK: Supple. No masses LUNGS: Clear to auscultation. No presence of rhonchi/wheezing/rales. Adequate chest expansion HEART: RRR, normal s1 and s2. ABDOMEN: Soft, nontender, no guarding, no peritoneal signs, and nondistended. BS +. No masses. EXTREMITIES: Without any cyanosis, clubbing, rash, lesions or edema. NEUROLOGIC: AOx3, no focal motor deficit. SKIN: no jaundice, no rashes  Assessment/Plan 81 year old male with past medical history of gout, hypertension, CKD, coming for evaluation of iron deficiency anemia.   Harvel Quale, MD 10/02/2021, 10:15 AM

## 2021-10-02 NOTE — Op Note (Signed)
[facility] Patient Name: Anthony Burton Procedure Date: 10/02/2021 11:28 AM MRN: [id number] Date of Birth: [birth year] Attending MD: Maylon Peppers ,  CSN: [id number] Age: 81 Admit Type: Outpatient Procedure:                Upper GI endoscopy Indications:              Iron deficiency anemia Providers:                Maylon Peppers, Lambert Mody, Caprice Kluver Referring MD:              Medicines:                Monitored Anesthesia Care Complications:            No immediate complications. Estimated Blood Loss:     Estimated blood loss: none. Procedure:                Pre-Anesthesia Assessment:                           - Prior to the procedure, a History and Physical                            was performed, and patient medications, allergies                            and sensitivities were reviewed. The patient's                            tolerance of previous anesthesia was reviewed.                           - The risks and benefits of the procedure and the                            sedation options and risks were discussed with the                            patient. All questions were answered and informed                            consent was obtained.                           - ASA Grade Assessment: II - A patient with mild                            systemic disease.                           After obtaining informed consent, the endoscope was                            passed under direct vision. Throughout the                            procedure, the patient's blood pressure, pulse,  and                            oxygen saturations were monitored continuously. The                            [id number]-[id number] ([id number]) scope was introduced through the                            mouth, and advanced to the second part of duodenum.                            The upper GI endoscopy was accomplished without                            difficulty. The patient tolerated the  procedure                            well. Scope In: 11:40:22 AM Scope Out: 11:48:47 AM Total Procedure Duration: 0 hours 8 minutes 25 seconds  Findings:      Laryn had presence of congestion and coffee gound contents with adhered       mucus.      A 2 cm hiatal hernia was present.      The entire examined stomach was normal. Biopsies were taken with a cold       forceps for Helicobacter pylori testing.      Patchy mild inflammation characterized by congestion (edema) and       erythema was found in the duodenal bulb. Biopsies were taken with a cold       forceps for histology. Impression:               Noreene Filbert had presence of congestion and coffee gound                            contents with adhered mucus.                           - 2 cm hiatal hernia.                           - Normal stomach. Biopsied.                           - Duodenitis. Biopsied. Moderate Sedation:      Per Anesthesia Care Recommendation:           - Discharge patient to home (ambulatory).                           - Resume previous diet.                           - Await pathology results.                           - Use Prilosec (omeprazole) 40 mg PO daily.                           -  No ibuprofen, naproxen, or other non-steroidal                            anti-inflammatory drugs.                           - ENT referral Procedure Code(s):        --- Professional ---                           [phone], Esophagogastroduodenoscopy, flexible,                            transoral; with biopsy, single or multiple Diagnosis Code(s):        --- Professional ---                           K44.9, Diaphragmatic hernia without obstruction or                            gangrene                           K29.80, Duodenitis without bleeding                           D50.9, Iron deficiency anemia, unspecified CPT copyright 2019 American Medical Association. All rights reserved. The codes documented in this report are  preliminary and upon coder review may  be revised to meet current compliance requirements. Maylon Peppers, MD Maylon Peppers,  10/02/2021 11:53:55 AM This report has been signed electronically. Number of Addenda: 0

## 2021-10-02 NOTE — Op Note (Signed)
[facility] Patient Name: Anthony Burton Procedure Date: 10/02/2021 11:27 AM MRN: [id number] Date of Birth: June 10, 1940 Attending MD: Maylon Peppers ,  CSN: [id number] Age: 81 Admit Type: Outpatient Procedure:                Colonoscopy Indications:              Iron deficiency anemia Providers:                Maylon Peppers, Lambert Mody, Caprice Kluver Referring MD:              Medicines:                Monitored Anesthesia Care Complications:            No immediate complications. Estimated Blood Loss:     Estimated blood loss: none. Procedure:                Pre-Anesthesia Assessment:                           - Prior to the procedure, a History and Physical                            was performed, and patient medications, allergies                            and sensitivities were reviewed. The patient's                            tolerance of previous anesthesia was reviewed.                           - The risks and benefits of the procedure and the                            sedation options and risks were discussed with the                            patient. All questions were answered and informed                            consent was obtained.                           - ASA Grade Assessment: III - A patient with severe                            systemic disease.                           After obtaining informed consent, the colonoscope                            was passed under direct vision. Throughout the                            procedure, the patient's blood pressure, pulse, and  oxygen saturations were monitored continuously. The                            [id number]-[id number] ([id number]) scope was introduced through                            the anus and advanced to the the cecum, identified                            by appendiceal orifice and ileocecal valve. The                            colonoscopy was performed without  difficulty. The                            patient tolerated the procedure well. The quality                            of the bowel preparation was good. Scope In: 11:55:04 AM Scope Out: 12:22:13 PM Scope Withdrawal Time: 0 hours 15 minutes 57 seconds  Total Procedure Duration: 0 hours 27 minutes 9 seconds  Findings:      The perianal and digital rectal examinations were normal.      Four semi-pedunculated polyps were found in the transverse colon and       ascending colon. The polyps were 4 to 5 mm in size. These polyps were       removed with a cold snare. Resection and retrieval were complete.      Two sessile polyps were found in the sigmoid colon and descending colon.       The polyps were 3 to 8 mm in size. These polyps were removed with a cold       snare. Resection and retrieval were complete.      The retroflexed view of the distal rectum and anal verge was normal and       showed no anal or rectal abnormalities. Impression:               - Four 4 to 5 mm polyps in the transverse colon and                            in the ascending colon, removed with a cold snare.                            Resected and retrieved.                           - Two 3 to 8 mm polyps in the sigmoid colon and in                            the descending colon, removed with a cold snare.                            Resected and retrieved.                           -  The distal rectum and anal verge are normal on                            retroflexion view. Moderate Sedation:      Per Anesthesia Care Recommendation:           - Discharge patient to home (ambulatory).                           - Resume previous diet.                           - Await pathology results.                           - Repeat colonoscopy is not recommended due to                            current age (79 years or older) for screening                            purposes. Procedure Code(s):        --- Professional ---                            [phone], Colonoscopy, flexible; with removal of                            tumor(s), polyp(s), or other lesion(s) by snare                            technique Diagnosis Code(s):        --- Professional ---                           K63.5, Polyp of colon                           D50.9, Iron deficiency anemia, unspecified CPT copyright 2019 American Medical Association. All rights reserved. The codes documented in this report are preliminary and upon coder review may  be revised to meet current compliance requirements. Maylon Peppers, MD Maylon Peppers,  10/02/2021 12:35:32 PM This report has been signed electronically. Number of Addenda: 0

## 2021-10-02 NOTE — Transfer of Care (Signed)
Immediate Anesthesia Transfer of Care Note  Patient: Anthony Burton  Procedure(s) Performed: COLONOSCOPY WITH PROPOFOL ESOPHAGOGASTRODUODENOSCOPY (EGD) WITH PROPOFOL BIOPSY POLYPECTOMY  Patient Location: Short Stay  Anesthesia Type:General  Level of Consciousness: awake  Airway & Oxygen Therapy: Patient Spontanous Breathing  Post-op Assessment: Report given to RN and Post -op Vital signs reviewed and stable  Post vital signs: Reviewed and stable  Last Vitals:  Vitals Value Taken Time  BP 103/79 10/02/21 1229  Temp 97.6 1229  Pulse 74 10/02/21 1229  Resp 18 10/02/21 1229  SpO2 94 % 10/02/21 1234    Last Pain:  Vitals:   10/02/21 1229  TempSrc: Oral  PainSc: 0-No pain      Patients Stated Pain Goal: 7 (93/96/88 6484)  Complications: No notable events documented.

## 2021-10-03 ENCOUNTER — Encounter (INDEPENDENT_AMBULATORY_CARE_PROVIDER_SITE_OTHER): Payer: Self-pay | Admitting: *Deleted

## 2021-10-03 NOTE — Anesthesia Postprocedure Evaluation (Signed)
Anesthesia Post Note  Patient: Anthony Burton  Procedure(s) Performed: COLONOSCOPY WITH PROPOFOL ESOPHAGOGASTRODUODENOSCOPY (EGD) WITH PROPOFOL BIOPSY POLYPECTOMY  Patient location during evaluation: Phase II Anesthesia Type: General Level of consciousness: awake Pain management: pain level controlled Vital Signs Assessment: post-procedure vital signs reviewed and stable Respiratory status: spontaneous breathing and respiratory function stable Cardiovascular status: blood pressure returned to baseline and stable Postop Assessment: no headache and no apparent nausea or vomiting Anesthetic complications: no Comments: Late entry   No notable events documented.   Last Vitals:  Vitals:   10/02/21 1229 10/02/21 1234  BP: 103/79   Pulse: 74   Resp: 18   Temp:    SpO2:  94%    Last Pain:  Vitals:   10/03/21 1040  TempSrc:   PainSc: 0-No pain                 Louann Sjogren

## 2021-10-04 LAB — SURGICAL PATHOLOGY

## 2021-10-05 ENCOUNTER — Other Ambulatory Visit (INDEPENDENT_AMBULATORY_CARE_PROVIDER_SITE_OTHER): Payer: Self-pay | Admitting: Gastroenterology

## 2021-10-05 DIAGNOSIS — B9681 Helicobacter pylori [H. pylori] as the cause of diseases classified elsewhere: Secondary | ICD-10-CM

## 2021-10-05 MED ORDER — BISMUTH 262 MG PO CHEW: 2.0000 | CHEWABLE_TABLET | Freq: Four times a day (QID) | ORAL | 0 refills | Status: DC

## 2021-10-05 MED ORDER — METRONIDAZOLE 500 MG PO TABS: 500.0000 mg | ORAL_TABLET | Freq: Three times a day (TID) | ORAL | 0 refills | Status: AC

## 2021-10-05 MED ORDER — OMEPRAZOLE 40 MG PO CPDR: 40.0000 mg | DELAYED_RELEASE_CAPSULE | Freq: Two times a day (BID) | ORAL | 0 refills | Status: DC

## 2021-10-05 MED ORDER — TETRACYCLINE HCL 500 MG PO CAPS: 500.0000 mg | ORAL_CAPSULE | Freq: Four times a day (QID) | ORAL | 0 refills | Status: AC

## 2021-10-08 ENCOUNTER — Encounter (HOSPITAL_COMMUNITY): Payer: Self-pay | Admitting: Gastroenterology

## 2021-12-06 ENCOUNTER — Emergency Department (HOSPITAL_COMMUNITY): Payer: Medicare Other

## 2021-12-06 ENCOUNTER — Observation Stay (HOSPITAL_COMMUNITY): Payer: Medicare Other

## 2021-12-06 ENCOUNTER — Observation Stay (HOSPITAL_COMMUNITY)
Admission: EM | Admit: 2021-12-06 | Discharge: 2021-12-07 | Disposition: A | Discharge status: Other Acute Inpatient Hospital | Payer: Medicare Other | Attending: Family Medicine | Admitting: Family Medicine

## 2021-12-06 ENCOUNTER — Encounter (HOSPITAL_COMMUNITY): Payer: Self-pay | Admitting: Emergency Medicine

## 2021-12-06 DIAGNOSIS — R7402 Elevation of levels of lactic acid dehydrogenase (LDH): Secondary | ICD-10-CM | POA: Diagnosis not present

## 2021-12-06 DIAGNOSIS — Z66 Do not resuscitate: Secondary | ICD-10-CM | POA: Diagnosis not present

## 2021-12-06 DIAGNOSIS — W19XXXA Unspecified fall, initial encounter: Secondary | ICD-10-CM | POA: Diagnosis present

## 2021-12-06 DIAGNOSIS — R1310 Dysphagia, unspecified: Secondary | ICD-10-CM | POA: Diagnosis not present

## 2021-12-06 DIAGNOSIS — Z515 Encounter for palliative care: Secondary | ICD-10-CM | POA: Diagnosis not present

## 2021-12-06 DIAGNOSIS — C3491 Malignant neoplasm of unspecified part of right bronchus or lung: Secondary | ICD-10-CM

## 2021-12-06 DIAGNOSIS — C3490 Malignant neoplasm of unspecified part of unspecified bronchus or lung: Secondary | ICD-10-CM | POA: Diagnosis not present

## 2021-12-06 DIAGNOSIS — B9681 Helicobacter pylori [H. pylori] as the cause of diseases classified elsewhere: Secondary | ICD-10-CM

## 2021-12-06 DIAGNOSIS — R7303 Prediabetes: Secondary | ICD-10-CM | POA: Insufficient documentation

## 2021-12-06 DIAGNOSIS — E871 Hypo-osmolality and hyponatremia: Secondary | ICD-10-CM | POA: Diagnosis not present

## 2021-12-06 DIAGNOSIS — F17210 Nicotine dependence, cigarettes, uncomplicated: Secondary | ICD-10-CM | POA: Insufficient documentation

## 2021-12-06 DIAGNOSIS — K920 Hematemesis: Principal | ICD-10-CM | POA: Insufficient documentation

## 2021-12-06 DIAGNOSIS — Z79899 Other long term (current) drug therapy: Secondary | ICD-10-CM | POA: Insufficient documentation

## 2021-12-06 DIAGNOSIS — K2980 Duodenitis without bleeding: Secondary | ICD-10-CM | POA: Diagnosis not present

## 2021-12-06 DIAGNOSIS — R531 Weakness: Secondary | ICD-10-CM | POA: Diagnosis not present

## 2021-12-06 DIAGNOSIS — I129 Hypertensive chronic kidney disease with stage 1 through stage 4 chronic kidney disease, or unspecified chronic kidney disease: Secondary | ICD-10-CM | POA: Insufficient documentation

## 2021-12-06 DIAGNOSIS — R0602 Shortness of breath: Secondary | ICD-10-CM | POA: Diagnosis not present

## 2021-12-06 DIAGNOSIS — N1832 Chronic kidney disease, stage 3b: Secondary | ICD-10-CM | POA: Diagnosis not present

## 2021-12-06 DIAGNOSIS — R918 Other nonspecific abnormal finding of lung field: Secondary | ICD-10-CM | POA: Insufficient documentation

## 2021-12-06 DIAGNOSIS — D631 Anemia in chronic kidney disease: Secondary | ICD-10-CM | POA: Insufficient documentation

## 2021-12-06 DIAGNOSIS — Z7189 Other specified counseling: Secondary | ICD-10-CM | POA: Diagnosis not present

## 2021-12-06 DIAGNOSIS — W19XXXD Unspecified fall, subsequent encounter: Secondary | ICD-10-CM

## 2021-12-06 DIAGNOSIS — F1011 Alcohol abuse, in remission: Secondary | ICD-10-CM | POA: Diagnosis present

## 2021-12-06 DIAGNOSIS — R7989 Other specified abnormal findings of blood chemistry: Secondary | ICD-10-CM | POA: Diagnosis present

## 2021-12-06 DIAGNOSIS — M109 Gout, unspecified: Secondary | ICD-10-CM | POA: Diagnosis present

## 2021-12-06 LAB — CBC WITH DIFFERENTIAL/PLATELET
Abs Immature Granulocytes: 0.06 10*3/uL (ref 0.00–0.07)
Basophils Absolute: 0.1 10*3/uL (ref 0.0–0.1)
Basophils Relative: 1 %
Eosinophils Absolute: 0.6 10*3/uL — ABNORMAL HIGH (ref 0.0–0.5)
Eosinophils Relative: 8 %
HCT: 36.4 % — ABNORMAL LOW (ref 39.0–52.0)
Hemoglobin: 11.5 g/dL — ABNORMAL LOW (ref 13.0–17.0)
Immature Granulocytes: 1 %
Lymphocytes Relative: 24 %
Lymphs Abs: 1.8 10*3/uL (ref 0.7–4.0)
MCH: 25.3 pg — ABNORMAL LOW (ref 26.0–34.0)
MCHC: 31.6 g/dL (ref 30.0–36.0)
MCV: 80 fL (ref 80.0–100.0)
Monocytes Absolute: 0.6 10*3/uL (ref 0.1–1.0)
Monocytes Relative: 7 %
Neutro Abs: 4.5 10*3/uL (ref 1.7–7.7)
Neutrophils Relative %: 59 %
Platelets: 305 10*3/uL (ref 150–400)
RBC: 4.55 MIL/uL (ref 4.22–5.81)
RDW: 17.6 % — ABNORMAL HIGH (ref 11.5–15.5)
WBC: 7.6 10*3/uL (ref 4.0–10.5)
nRBC: 0 % (ref 0.0–0.2)

## 2021-12-06 LAB — BRAIN NATRIURETIC PEPTIDE: B Natriuretic Peptide: 56 pg/mL (ref 0.0–100.0)

## 2021-12-06 LAB — COMPREHENSIVE METABOLIC PANEL
ALT: 10 U/L (ref 0–44)
AST: 17 U/L (ref 15–41)
Albumin: 3.5 g/dL (ref 3.5–5.0)
Alkaline Phosphatase: 86 U/L (ref 38–126)
Anion gap: 9 (ref 5–15)
BUN: 22 mg/dL (ref 8–23)
CO2: 24 mmol/L (ref 22–32)
Calcium: 9.1 mg/dL (ref 8.9–10.3)
Chloride: 97 mmol/L — ABNORMAL LOW (ref 98–111)
Creatinine, Ser: 1.6 mg/dL — ABNORMAL HIGH (ref 0.61–1.24)
GFR, Estimated: 44 mL/min — ABNORMAL LOW (ref >60)
Glucose, Bld: 156 mg/dL — ABNORMAL HIGH (ref 70–99)
Potassium: 4.1 mmol/L (ref 3.5–5.1)
Sodium: 130 mmol/L — ABNORMAL LOW (ref 135–145)
Total Bilirubin: 0.6 mg/dL (ref 0.3–1.2)
Total Protein: 8.5 g/dL — ABNORMAL HIGH (ref 6.5–8.1)

## 2021-12-06 LAB — LACTIC ACID, PLASMA
Lactic Acid, Venous: 2.5 mmol/L (ref 0.5–1.9)
Lactic Acid, Venous: 2.2 mmol/L (ref 0.5–1.9)
Lactic Acid, Venous: 1.5 mmol/L (ref 0.5–1.9)

## 2021-12-06 LAB — LIPASE, BLOOD: Lipase: 39 U/L (ref 11–51)

## 2021-12-06 LAB — AMMONIA: Ammonia: 13 umol/L (ref 9–35)

## 2021-12-06 LAB — TROPONIN I (HIGH SENSITIVITY)
Troponin I (High Sensitivity): 5 ng/L (ref <18)
Troponin I (High Sensitivity): 5 ng/L (ref <18)

## 2021-12-06 LAB — TYPE AND SCREEN
ABO/RH(D): B POS
Antibody Screen: NEGATIVE

## 2021-12-06 LAB — MRSA NEXT GEN BY PCR, NASAL: MRSA by PCR Next Gen: DETECTED — AB

## 2021-12-06 LAB — PROTIME-INR
INR: 1.1 (ref 0.8–1.2)
Prothrombin Time: 14.4 seconds (ref 11.4–15.2)

## 2021-12-06 LAB — ABO/RH: ABO/RH(D): B POS

## 2021-12-06 LAB — ETHANOL: Alcohol, Ethyl (B): <10 mg/dL (ref <10)

## 2021-12-06 MED ORDER — CHLORHEXIDINE GLUCONATE CLOTH 2 % EX PADS
6.0000 | MEDICATED_PAD | Freq: Every day | CUTANEOUS | Status: DC
  Administered 2021-12-06: 6 via TOPICAL

## 2021-12-06 MED ORDER — ORAL CARE MOUTH RINSE: 15.0000 mL | OROMUCOSAL | Status: DC | PRN

## 2021-12-06 MED ORDER — IPRATROPIUM-ALBUTEROL 0.5-2.5 (3) MG/3ML IN SOLN
3.0000 mL | Freq: Three times a day (TID) | RESPIRATORY_TRACT | Status: DC
  Administered 2021-12-06: 3 mL via RESPIRATORY_TRACT
  Filled 2021-12-06: qty 3

## 2021-12-06 MED ORDER — BISACODYL 5 MG PO TBEC: 5.0000 mg | DELAYED_RELEASE_TABLET | Freq: Every day | ORAL | Status: DC | PRN

## 2021-12-06 MED ORDER — SODIUM CHLORIDE 0.9 % IV SOLN
2.0000 g | Freq: Once | INTRAVENOUS | Status: AC
  Administered 2021-12-06: 2 g via INTRAVENOUS
  Filled 2021-12-06: qty 20

## 2021-12-06 MED ORDER — ACETAMINOPHEN 325 MG PO TABS: 650.0000 mg | ORAL_TABLET | Freq: Four times a day (QID) | ORAL | Status: DC | PRN

## 2021-12-06 MED ORDER — METOPROLOL TARTRATE 25 MG PO TABS
25.0000 mg | ORAL_TABLET | Freq: Two times a day (BID) | ORAL | Status: DC
  Administered 2021-12-06 – 2021-12-07 (×3): 25 mg via ORAL
  Filled 2021-12-06 (×3): qty 1

## 2021-12-06 MED ORDER — ONDANSETRON HCL 4 MG PO TABS: 4.0000 mg | ORAL_TABLET | Freq: Four times a day (QID) | ORAL | Status: DC | PRN

## 2021-12-06 MED ORDER — SODIUM CHLORIDE 0.9 % IV SOLN: 2.0000 g | INTRAVENOUS | Status: DC

## 2021-12-06 MED ORDER — PANTOPRAZOLE INFUSION (NEW) - SIMPLE MED
8.0000 mg/h | INTRAVENOUS | Status: DC
  Administered 2021-12-06 (×2): 8 mg/h via INTRAVENOUS
  Filled 2021-12-06: qty 100
  Filled 2021-12-06: qty 80
  Filled 2021-12-06 (×2): qty 100

## 2021-12-06 MED ORDER — LACTATED RINGERS IV BOLUS
500.0000 mL | Freq: Once | INTRAVENOUS | Status: AC
  Administered 2021-12-06: 500 mL via INTRAVENOUS

## 2021-12-06 MED ORDER — PANTOPRAZOLE SODIUM 40 MG PO TBEC
40.0000 mg | DELAYED_RELEASE_TABLET | Freq: Every day | ORAL | Status: DC
  Administered 2021-12-07: 40 mg via ORAL
  Filled 2021-12-06: qty 1

## 2021-12-06 MED ORDER — SODIUM CHLORIDE 0.9 % IV SOLN
500.0000 mg | INTRAVENOUS | Status: DC
  Administered 2021-12-06 – 2021-12-07 (×2): 500 mg via INTRAVENOUS
  Filled 2021-12-06 (×3): qty 5

## 2021-12-06 MED ORDER — PANTOPRAZOLE SODIUM 40 MG IV SOLR: 40.0000 mg | Freq: Two times a day (BID) | INTRAVENOUS | Status: DC

## 2021-12-06 MED ORDER — IPRATROPIUM-ALBUTEROL 0.5-2.5 (3) MG/3ML IN SOLN
3.0000 mL | Freq: Three times a day (TID) | RESPIRATORY_TRACT | Status: DC
  Administered 2021-12-06 – 2021-12-07 (×3): 3 mL via RESPIRATORY_TRACT
  Filled 2021-12-06 (×3): qty 3

## 2021-12-06 MED ORDER — OXYCODONE HCL 5 MG PO TABS: 5.0000 mg | ORAL_TABLET | Freq: Four times a day (QID) | ORAL | Status: DC | PRN

## 2021-12-06 MED ORDER — ACETAMINOPHEN 650 MG RE SUPP: 650.0000 mg | Freq: Four times a day (QID) | RECTAL | Status: DC | PRN

## 2021-12-06 MED ORDER — ONDANSETRON HCL 4 MG/2ML IJ SOLN: 4.0000 mg | Freq: Four times a day (QID) | INTRAMUSCULAR | Status: DC | PRN

## 2021-12-06 MED ORDER — MUPIROCIN 2 % EX OINT
1.0000 | TOPICAL_OINTMENT | Freq: Two times a day (BID) | CUTANEOUS | Status: DC
  Administered 2021-12-06 – 2021-12-07 (×2): 1 via NASAL
  Filled 2021-12-06: qty 22

## 2021-12-06 MED ORDER — PANTOPRAZOLE 80MG IVPB - SIMPLE MED
80.0000 mg | Freq: Once | INTRAVENOUS | Status: AC
  Administered 2021-12-06: 80 mg via INTRAVENOUS

## 2021-12-06 MED ORDER — OCTREOTIDE LOAD VIA INFUSION
50.0000 ug | Freq: Once | INTRAVENOUS | Status: AC
  Administered 2021-12-06: 50 ug via INTRAVENOUS
  Filled 2021-12-06: qty 25

## 2021-12-06 MED ORDER — SODIUM CHLORIDE 0.9 % IV SOLN
50.0000 ug/h | INTRAVENOUS | Status: DC
  Administered 2021-12-06: 50 ug/h via INTRAVENOUS
  Filled 2021-12-06 (×4): qty 1

## 2021-12-06 MED ORDER — NICOTINE 21 MG/24HR TD PT24: 21.0000 mg | MEDICATED_PATCH | Freq: Every day | TRANSDERMAL | Status: DC | PRN

## 2021-12-06 MED ORDER — OCTREOTIDE ACETATE 500 MCG/ML IJ SOLN
INTRAMUSCULAR | Status: AC
  Filled 2021-12-06: qty 1

## 2021-12-06 MED ORDER — SODIUM CHLORIDE 0.9 % IV SOLN
2.0000 g | INTRAVENOUS | Status: DC
  Administered 2021-12-07: 2 g via INTRAVENOUS
  Filled 2021-12-06: qty 20

## 2021-12-06 MED ORDER — MUPIROCIN 2 % EX OINT: 1.0000 | TOPICAL_OINTMENT | Freq: Two times a day (BID) | CUTANEOUS | Status: DC

## 2021-12-06 NOTE — Progress Notes (Signed)
Date and time results received: 12/06/21 1028 (use smartphrase ".now" to insert current time)  Test: MRSA PCR Critical Value: Positive  Name of Provider Notified: Wynetta Emery, MD.  Orders Received? Or Actions Taken?: Orders Received - See Orders for details

## 2021-12-06 NOTE — H&P (Signed)
History and Physical  Baldwin PJK:[id number] DOB: 1940/03/04 DOA: 12/06/2021  PCP: Carrolyn Meiers, MD  Patient coming from: [facility]  Level of care: Telemetry  I have personally briefly reviewed patient's old medical records in [place]  Chief Complaint: coughing up blood   HPI: Anthony Burton is a 81 year old gentleman long-term resident of [facility] with prior history of alcohol abuse and bilateral subdural hematoma, stage IIIb CKD, chronic hyponatremia, gout, anemia, hypertension who was brought into the emergency department by EMS after he began to vomit bright red blood.  He apparently had an EGD with Dr. Jenetta Downer in August 2023 with findings of duodenitis treated with PPI therapy.  No evidence of esophageal varices found.  Patient complained of shortness of breath on arrival and was placed on 5 L nasal cannula but subsequently reduced to 2 L nasal cannula after he settled down.  The ED started him on octreotide infusion and Protonix infusion.  His chest x-ray showed persistent infiltrates in the right mid and lower lung.  There was also a suspicious lesion noted in the right upper lobe lung that the radiologist recommended a CT chest be performed to further evaluate.  The patient is being admitted for further evaluation.    Past Medical History:  Diagnosis Date   Alcohol dependence ([place])    Gout    Hypertension    Kidney disease     Past Surgical History:  Procedure Laterality Date   BIOPSY  10/02/2021   Procedure: BIOPSY;  Surgeon: Harvel Quale, MD;  [facility];  Service: Gastroenterology;;   COLONOSCOPY WITH PROPOFOL N/A 10/02/2021   Procedure: COLONOSCOPY WITH PROPOFOL;  Surgeon: Harvel Quale, MD;  [facility];  Service: Gastroenterology;  Laterality: N/A;  1015   ESOPHAGOGASTRODUODENOSCOPY (EGD) WITH PROPOFOL N/A 10/02/2021   Procedure: ESOPHAGOGASTRODUODENOSCOPY (EGD) WITH  PROPOFOL;  Surgeon: Harvel Quale, MD;  [facility];  Service: Gastroenterology;  Laterality: N/A;   POLYPECTOMY  10/02/2021   Procedure: POLYPECTOMY;  Surgeon: Harvel Quale, MD;  [facility];  Service: Gastroenterology;;     reports that he has been smoking cigarettes. He has been exposed to tobacco smoke. He has never used smokeless tobacco. He reports that he does not currently use alcohol. He reports that he does not use drugs.  No Known Allergies  History reviewed. No pertinent family history.  Prior to Admission medications   Medication Sig Start Date End Date Taking? Authorizing Provider  chlorhexidine (PERIDEX) 0.12 % solution 15 mLs by Mouth Rinse route 2 (two) times daily. 03/20/17  Yes Shahmehdi, Valeria Batman, MD  folic acid (FOLVITE) 1 MG tablet Take 1 tablet (1 mg total) by mouth daily. 03/21/17  Yes Shahmehdi, Seyed A, MD  ibuprofen (ADVIL) 800 MG tablet Take 800 mg by mouth every 8 (eight) hours as needed for moderate pain.   Yes [provider]  metoprolol tartrate (LOPRESSOR) 25 MG tablet Take 25 mg by mouth 2 (two) times daily. 06/15/19  Yes [provider]  omeprazole (PRILOSEC) 40 MG capsule Take 1 capsule (40 mg total) by mouth 2 (two) times daily for 14 days. After finishing, go down to once a day dosing. 10/05/21 12/06/21 Yes Harvel Quale, MD  Bismuth 262 MG CHEW Chew 2 each by mouth every 6 (six) hours. Patient not taking: Reported on 12/06/2021 10/05/21   Harvel Quale, MD  thiamine 100 MG tablet Take  1 tablet (100 mg total) by mouth daily. Patient not taking: Reported on 09/25/2021 03/21/17   Deatra James, MD    Physical Exam: Vitals:   12/06/21 0530 12/06/21 0700 12/06/21 0730 12/06/21 0733  BP: 108/71 111/74 106/71   Pulse: 89 87 92   Resp: 19 18 19    Temp:    98 F (36.7 C)  TempSrc:    Oral  SpO2: 97% 96% 96%   Height:        Constitutional: frail, elderly, weak  appearing male, NAD, calm, comfortable Eyes: PERRL, lids and conjunctivae normal ENMT: Mucous membranes are pale. Posterior pharynx clear of any exudate or lesions. Neck: normal, supple, no masses, no thyromegaly Respiratory: diminished BS RLL.  Normal respiratory effort. No accessory muscle use.  Cardiovascular: normal s1, s2 sounds, no murmurs / rubs / gallops. No extremity edema. 2+ pedal pulses. No carotid bruits.  Abdomen: no tenderness, no masses palpated. No hepatosplenomegaly. Bowel sounds positive.  Musculoskeletal: no clubbing / cyanosis. No joint deformity upper and lower extremities. Good ROM, no contractures. Normal muscle tone.  Skin: no rashes, lesions, ulcers. No induration Neurologic: CN 2-12 grossly intact. Sensation intact, DTR normal. Strength 5/5 in all 4.  Psychiatric: poor judgment and insight. Alert and oriented x 3. Normal mood.   Labs on Admission: I have personally reviewed following labs and imaging studies  CBC: Recent Labs  Lab 12/06/21 0343  WBC 7.6  NEUTROABS 4.5  HGB 11.5*  HCT 36.4*  MCV 80.0  PLT 440   Basic Metabolic Panel: Recent Labs  Lab 12/06/21 0343  NA 130*  K 4.1  CL 97*  CO2 24  GLUCOSE 156*  BUN 22  CREATININE 1.60*  CALCIUM 9.1   GFR: CrCl cannot be calculated (Unknown ideal weight.). Liver Function Tests: Recent Labs  Lab 12/06/21 0343  AST 17  ALT 10  ALKPHOS 86  BILITOT 0.6  PROT 8.5*  ALBUMIN 3.5   Recent Labs  Lab 12/06/21 0343  LIPASE 39   Recent Labs  Lab 12/06/21 0343  AMMONIA 13   Coagulation Profile: Recent Labs  Lab 12/06/21 0343  INR 1.1   Cardiac Enzymes: No results for input(s): "CKTOTAL", "CKMB", "CKMBINDEX", "TROPONINI" in the last 168 hours. BNP (last 3 results) No results for input(s): "PROBNP" in the last 8760 hours. HbA1C: No results for input(s): "HGBA1C" in the last 72 hours. CBG: No results for input(s): "GLUCAP" in the last 168 hours. Lipid Profile: No results for  input(s): "CHOL", "HDL", "LDLCALC", "TRIG", "CHOLHDL", "LDLDIRECT" in the last 72 hours. Thyroid Function Tests: No results for input(s): "TSH", "T4TOTAL", "FREET4", "T3FREE", "THYROIDAB" in the last 72 hours. Anemia Panel: No results for input(s): "VITAMINB12", "FOLATE", "FERRITIN", "TIBC", "IRON", "RETICCTPCT" in the last 72 hours. Urine analysis:    Component Value Date/Time   COLORURINE YELLOW 07/03/2019 2110   APPEARANCEUR CLEAR 07/03/2019 2110   LABSPEC 1.008 07/03/2019 2110   PHURINE 6.0 07/03/2019 2110   GLUCOSEU NEGATIVE 07/03/2019 2110   HGBUR MODERATE (A) 07/03/2019 2110   BILIRUBINUR NEGATIVE 07/03/2019 2110   KETONESUR NEGATIVE 07/03/2019 2110   PROTEINUR NEGATIVE 07/03/2019 2110   NITRITE NEGATIVE 07/03/2019 2110   LEUKOCYTESUR MODERATE (A) 07/03/2019 2110    Radiological Exams on Admission: DG Chest Port 1 View  Result Date: 12/06/2021 CLINICAL DATA:  Hematemesis EXAM: PORTABLE CHEST 1 VIEW COMPARISON:  08/17/2021 FINDINGS: Cardiac shadow is stable. Lungs are well aerated bilaterally. Diffuse airspace opacity is noted in the mid and lower right lung consistent  with focal infiltrate. No sizable effusion is seen. Stable architectural distortion in the right upper lobe is seen. No bony abnormality is noted. IMPRESSION: Persistent architectural distortion in the right upper lobe. Again CT is recommended for further evaluation. Significant increase in right mid and lower lung infiltrate. Electronically Signed   By: Inez Catalina M.D.   On: 12/06/2021 04:05    EKG: Independently reviewed.   Assessment/Plan Principal Problem:   Hematemesis Active Problems:   Abnormality of lung on CXR   Gout   Falls   Weakness   Hyponatremia   Prediabetes   H/O alcohol abuse   Stage 3b chronic kidney disease (CKD) (HCC)   Duodenitis   Elevated lactic acid level   Anemia in chronic kidney disease (CKD)   Infiltrate of lower lobe of right lung present on imaging study   Infiltrate of  middle lobe of right lung present on imaging study   Hematemesis  - cause undetermined - fortunately bleeding seems to have stopped - GI consulted by ED for his history of duodenitis but low suspicion for variceal bleed at this time - continue IV protonix for now - continue supportive measures  Right lung middle/lower lung infiltrates / Abnormal finding on CXR - continue pneumonia treatments - persistent RUL abnormality, will obtain chest CT  - follow up findings from chest CT, may need to get pulmonary involved - there was concern that he is aspirating - requesting SLP evaluation   Stage 3b CKD - stable creatinine (baseline 1.8-2) - followed by Dr. Theador Hawthorne  - renally dose treatments as able - follow renal function  Chronic Hyponatremia - has been stable around his baseline of 130 - follow  Prediabetes  - diet controlled  Duodenitis  - from recent EGD 8/23 with Dr. Jenetta Downer - continue PPI therapy   Elevated lactic acid - recheck and hydrate - secondary to hypoxia - sepsis ruled out  Anemia in CKD - stable from recent testing - follow CBC   DVT prophylaxis: SCDs  Code Status: Full   Family Communication:   Disposition Plan: TBD   Consults called:   Admission status: OBS  Level of care: Telemetry Irwin Brakeman MD [facility] How to contact the [facility] Attending or Consulting provider [place] or covering provider during after hours 7P -7A, for this patient?  Check the care team in [facility] and look for a) attending/consulting [facility] provider listed and b) the [facility] team listed Log into [email or web address] and use [place]'s universal password to access. If you do not have the password, please contact the hospital operator. Locate the [facility] provider you are looking for under [facility] and page to a number that you can be directly reached. If you still have difficulty reaching the provider, please page the [facility] (Director on Call) for the Hospitalists listed on amion  for assistance.   If 7PM-7AM, please contact night-coverage [email or web address] Password [facility]  12/06/2021, 7:49 AM

## 2021-12-06 NOTE — Consult Note (Signed)
NAME:  Anthony Burton, MRN:  [id number], DOB:  30-Jul-1940, LOS: 0 ADMISSION DATE:  12/06/2021, CONSULTATION DATE:  12/06/21  REFERRING MD:  Wynetta Emery, [facility] , CHIEF COMPLAINT:  hemoptysis    History of Present Illness:  81 yobm smoker with poor dentition awaiting full mouth extraction on Nov 2 admitted 10/26 with new onset frank hemoptysis x ? 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  He apparently had an EGD with Dr. Jenetta Downer in August 2023 with findings of duodenitis treated with PPI therapy.  No evidence of esophageal varices found.  Patient complained of shortness of breath on arrival and was placed on 5 L nasal cannula but subsequently reduced to 2 L nasal cannula after he settled down.  The ED started him on octreotide infusion and Protonix infusion.  His chest x-ray showed persistent infiltrates in the right mid and lower lung > CT chest:  obst mass BI assoc with RLL atx  and second spiculated nodule RUL   Significant Hospital Events: Including procedures, antibiotic start and stop dates in addition to other pertinent events   MRSA PCR screen 10/26 :  POS   Interim History / Subjective:  Denies sob/ cp or further hemptysis   Objective   Blood pressure 128/74, pulse 92, temperature 98.7 F (37.1 C), temperature source Oral, resp. rate 19, height 5\' 8"  (1.727 m), weight 61 kg, SpO2 100 %.        Intake/Output Summary (Last 24 hours) at 12/06/2021 1410 Last data filed at 12/06/2021 1129 Gross per 24 hour  Intake 905.3 ml  Output --  Net 905.3 ml   Filed Weights   12/06/21 0819  Weight: 61 kg    Examination: Tmax:  99 General  appearance:    think slt hoarse bm comfortable at 30 degrees hob   At Rest 02 sats  97% on 2lpm   No jvd Oropharynx clear very poor dentition  Neck supple Lungs with a few scattered exp > insp rhonchi bilaterally and decreased bs R Base  RRR no s3 or or sign murmur Abd soft/ benign    Extr warm with no edema or clubbing noted Neuro  Sensorium alert and approp ,  no apparent motor deficits      Assessment & Plan:  1) new onset hemoptysis, 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 Recent Labs  Lab 12/06/21 0343 12/06/21 0834 12/06/21 1235  WBC 7.6  --   --   LATICACIDVEN 2.5* 2.2* 1.5  Liver Function Tests: Recent Labs  Lab 12/06/21 0343  AST 17  ALT 10  ALKPHOS 86  BILITOT 0.6  PROT 8.5*  ALBUMIN 3.5   Recent Labs  Lab 12/06/21 0343  LIPASE 39   Recent Labs  Lab 12/06/21 0343  AMMONIA 13    ABG No results found for: "PHART", "PCO2ART", "PO2ART", "HCO3", "TCO2", "ACIDBASEDEF", "O2SAT"   Coagulation Profile: Recent Labs  Lab 12/06/21 0343  INR 1.1    Cardiac Enzymes: No results for input(s): "CKTOTAL", "CKMB", "CKMBINDEX", "TROPONINI" in the last 168 hours.  HbA1C: No results found for: "HGBA1C"  CBG: No results for input(s): "GLUCAP" in the last 168 hours.     Past Medical History:  He,  has a past medical history of Alcohol dependence ([place]), Gout, Hypertension, and Kidney disease.   Surgical History:   Past Surgical History:  Procedure  Laterality Date   BIOPSY  10/02/2021   Procedure: BIOPSY;  Surgeon: Harvel Quale, MD;  [facility];  Service: Gastroenterology;;   COLONOSCOPY WITH PROPOFOL N/A 10/02/2021   Procedure: COLONOSCOPY WITH PROPOFOL;  Surgeon: Harvel Quale, MD;  [facility];  Service: Gastroenterology;  Laterality: N/A;  1015   ESOPHAGOGASTRODUODENOSCOPY (EGD) WITH PROPOFOL N/A 10/02/2021   Procedure: ESOPHAGOGASTRODUODENOSCOPY (EGD) WITH PROPOFOL;  Surgeon: Harvel Quale, MD;  [facility];  Service: Gastroenterology;  Laterality: N/A;   POLYPECTOMY  10/02/2021   Procedure: POLYPECTOMY;  Surgeon: Harvel Quale, MD;  [facility];  Service: Gastroenterology;;     Social History:   reports that he has been smoking cigarettes. He has been exposed to tobacco smoke. He has never used smokeless tobacco. He reports that he does not currently use alcohol. He reports that he does not use drugs.   Family History:  His family history is not on file.   Allergies No Known Allergies   Home Medications  Prior to Admission medications   Medication Sig Start Date End Date Taking? Authorizing Provider  chlorhexidine (PERIDEX) 0.12 % solution 15 mLs by Mouth Rinse route 2 (two) times daily. 03/20/17  Yes Shahmehdi, Valeria Batman, MD  folic acid (FOLVITE) 1 MG tablet Take 1 tablet (1 mg total) by mouth daily. 03/21/17  Yes Shahmehdi, Seyed A, MD  ibuprofen (ADVIL) 800 MG tablet Take 800 mg by mouth every 8 (eight) hours as needed for moderate pain.   Yes [provider]  metoprolol tartrate (LOPRESSOR) 25 MG tablet Take 25 mg by mouth 2 (two) times daily. 06/15/19  Yes [provider]  omeprazole (PRILOSEC) 40 MG capsule Take 1 capsule (40 mg total) by mouth 2 (two) times daily for 14 days. After finishing, go down to once a day dosing. 10/05/21 12/06/21 Yes Harvel Quale, MD  Bismuth 262 MG CHEW Chew 2 each by mouth  every 6 (six) hours. Patient not taking: Reported on 12/06/2021 10/05/21   Harvel Quale, MD  thiamine 100 MG tablet Take 1 tablet (100 mg total) by mouth daily. Patient not taking: Reported on 09/25/2021 03/21/17   Deatra James, MD      Christinia Gully, MD Pulmonary and [place] [phone]   After 7:00 pm call Elink  [phone]

## 2021-12-06 NOTE — ED Notes (Signed)
PT on 5 [address] [place]

## 2021-12-06 NOTE — ED Provider Notes (Signed)
[facility] EMERGENCY DEPARTMENT Provider Note   CSN: [id number] Arrival date & time: 12/06/21  1093     History  Chief Complaint  Patient presents with   Hematemesis    Anthony Burton is a 81 y.o. male.  Patient presents to the emergency department for evaluation of hematemesis.  Patient reports that he had an episode of vomiting tonight and there was blood.  At arrival to the emergency department he is in mild to moderate respiratory distress.  EMS reports that he had been doing fine during transport, difficulty breathing chest began.  This would be concerning for possible aspiration.       Home Medications Prior to Admission medications   Medication Sig Start Date End Date Taking? Authorizing Provider  Bismuth 262 MG CHEW Chew 2 each by mouth every 6 (six) hours. 10/05/21   Harvel Quale, MD  chlorhexidine (PERIDEX) 0.12 % solution 15 mLs by Mouth Rinse route 2 (two) times daily. 03/20/17   Shahmehdi, Valeria Batman, MD  folic acid (FOLVITE) 1 MG tablet Take 1 tablet (1 mg total) by mouth daily. 03/21/17   Shahmehdi, Valeria Batman, MD  ibuprofen (ADVIL) 800 MG tablet Take 800 mg by mouth every 8 (eight) hours as needed for moderate pain.    [provider]  metoprolol tartrate (LOPRESSOR) 25 MG tablet Take 25 mg by mouth 2 (two) times daily. 06/15/19   [provider]  omeprazole (PRILOSEC) 40 MG capsule Take 1 capsule (40 mg total) by mouth 2 (two) times daily for 14 days. After finishing, go down to once a day dosing. 10/05/21 10/19/21  Harvel Quale, MD  thiamine 100 MG tablet Take 1 tablet (100 mg total) by mouth daily. Patient not taking: Reported on 09/25/2021 03/21/17   Deatra James, MD      Allergies    Patient has no known allergies.    Review of Systems   Review of Systems  Physical Exam Updated Vital Signs BP 118/79   Pulse 98   Resp (!) 21   Ht 5\' 9"  (1.753 m)   SpO2 96%   BMI 21.81 kg/m  Physical Exam Vitals and nursing note  reviewed.  Constitutional:      General: He is not in acute distress.    Appearance: He is well-developed.  HENT:     Head: Normocephalic and atraumatic.     Mouth/Throat:     Mouth: Mucous membranes are moist.  Eyes:     General: Vision grossly intact. Gaze aligned appropriately.     Extraocular Movements: Extraocular movements intact.     Conjunctiva/sclera: Conjunctivae normal.  Cardiovascular:     Rate and Rhythm: Regular rhythm. Tachycardia present.     Pulses: Normal pulses.     Heart sounds: Normal heart sounds, S1 normal and S2 normal. No murmur heard.    No friction rub. No gallop.  Pulmonary:     Effort: Tachypnea and accessory muscle usage present. No respiratory distress.     Breath sounds: Rales present.  Abdominal:     Palpations: Abdomen is soft.     Tenderness: There is no abdominal tenderness. There is no guarding or rebound.     Hernia: No hernia is present.  Musculoskeletal:        General: No swelling.     Cervical back: Full passive range of motion without pain, normal range of motion and neck supple. No pain with movement, spinous process tenderness or muscular tenderness. Normal range of motion.  Right lower leg: No edema.     Left lower leg: No edema.  Skin:    General: Skin is warm and dry.     Capillary Refill: Capillary refill takes less than 2 seconds.     Findings: No ecchymosis, erythema, lesion or wound.  Neurological:     Mental Status: He is alert and oriented to person, place, and time.     GCS: GCS eye subscore is 4. GCS verbal subscore is 5. GCS motor subscore is 6.     Cranial Nerves: Cranial nerves 2-12 are intact.     Sensory: Sensation is intact.     Motor: Motor function is intact. No weakness or abnormal muscle tone.     Coordination: Coordination is intact.  Psychiatric:        Mood and Affect: Mood normal.        Speech: Speech normal.        Behavior: Behavior normal.     ED Results / Procedures / Treatments   Labs (all  labs ordered are listed, but only abnormal results are displayed) Labs Reviewed  CBC WITH DIFFERENTIAL/PLATELET - Abnormal; Notable for the following components:      Result Value   Hemoglobin 11.5 (*)    HCT 36.4 (*)    MCH 25.3 (*)    RDW 17.6 (*)    Eosinophils Absolute 0.6 (*)    All other components within normal limits  COMPREHENSIVE METABOLIC PANEL - Abnormal; Notable for the following components:   Sodium 130 (*)    Chloride 97 (*)    Glucose, Bld 156 (*)    Creatinine, Ser 1.60 (*)    Total Protein 8.5 (*)    GFR, Estimated 44 (*)    All other components within normal limits  LACTIC ACID, PLASMA - Abnormal; Notable for the following components:   Lactic Acid, Venous 2.5 (*)    All other components within normal limits  LIPASE, BLOOD  BRAIN NATRIURETIC PEPTIDE  ETHANOL  AMMONIA  PROTIME-INR  URINALYSIS, ROUTINE W REFLEX MICROSCOPIC  TYPE AND SCREEN  ABO/RH  TROPONIN I (HIGH SENSITIVITY)  TROPONIN I (HIGH SENSITIVITY)    EKG EKG Interpretation  Date/Time:  Thursday December 06 2021 03:40:59 EDT Ventricular Rate:  155 PR Interval:  125 QRS Duration: 78 QT Interval:  298 QTC Calculation: 434 R Axis:   69 Text Interpretation: Sinus rhythm with frequent PAC, PVC Anteroseptal infarct, old Confirmed by Orpah Greek [phone]) on 12/06/2021 5:11:31 AM  Radiology DG Chest Port 1 View  Result Date: 12/06/2021 CLINICAL DATA:  Hematemesis EXAM: PORTABLE CHEST 1 VIEW COMPARISON:  08/17/2021 FINDINGS: Cardiac shadow is stable. Lungs are well aerated bilaterally. Diffuse airspace opacity is noted in the mid and lower right lung consistent with focal infiltrate. No sizable effusion is seen. Stable architectural distortion in the right upper lobe is seen. No bony abnormality is noted. IMPRESSION: Persistent architectural distortion in the right upper lobe. Again CT is recommended for further evaluation. Significant increase in right mid and lower lung infiltrate.  Electronically Signed   By: Inez Catalina M.D.   On: 12/06/2021 04:05    Procedures Procedures    Medications Ordered in ED Medications  pantoprozole (PROTONIX) 80 mg /NS 100 mL infusion (8 mg/hr Intravenous New Bag/Given 12/06/21 0429)  pantoprazole (PROTONIX) injection 40 mg (has no administration in time range)  octreotide (SANDOSTATIN) 2 mcg/mL load via infusion 50 mcg (50 mcg Intravenous Bolus from Bag 12/06/21 0414)    And  octreotide (SANDOSTATIN) 500 mcg in sodium chloride 0.9 % 250 mL (2 mcg/mL) infusion (50 mcg/hr Intravenous New Bag/Given 12/06/21 0414)  pantoprazole (PROTONIX) 80 mg /NS 100 mL IVPB (0 mg Intravenous Stopped 12/06/21 0429)  cefTRIAXone (ROCEPHIN) 2 g in sodium chloride 0.9 % 100 mL IVPB (0 g Intravenous Stopped 12/06/21 0420)    ED Course/ Medical Decision Making/ A&P                           Medical Decision Making Amount and/or Complexity of Data Reviewed Independent Historian: EMS External Data Reviewed: labs, ECG and notes. Labs: ordered. Decision-making details documented in ED Course. Radiology: ordered and independent interpretation performed. Decision-making details documented in ED Course. ECG/medicine tests: ordered and independent interpretation performed. Decision-making details documented in ED Course.  Risk Prescription drug management.   Presents to the emergency department for evaluation of hematemesis.  Patient is ill-appearing on arrival, appears to be in some respiratory distress.  Peptic ulcer disease, gastritis, esophageal varices, aspiration pneumonia all considered at arrival.  Patient's past medical history included a history of alcohol abuse.  At arrival it was unclear if he has a history of cirrhosis or esophageal varices.  With his hematemesis, he was treated for possible varices with Rocephin, octreotide.  Pantoprazole drip was also initiated.  I have been able to review further records and he did have an EGD 2 months ago  that did not show varices, therefore variceal bleed is no longer in the differential diagnosis.  At that time he had an upper GI bleed with duodenitis.  Continue Protonix drip.  Patient continues to have a lesion in the right upper lung that is suspicious for cancer.  Will need CT of the chest to further evaluate.  He does have infiltrate in the right mid and lower lung fields that is new.  Primary pneumonia versus aspiration pneumonia.  Patient on supplemental oxygen by nasal cannula.  His breathing has significantly improved here.  Oxygenation is good and he has been weaned down from 5 L nasal cannula to 2 L.  Will require hospitalization for further work-up of his pneumonia and hematemesis.  He is stable at this time.  CRITICAL CARE Performed by: Orpah Greek   Total critical care time: 32 minutes  Critical care time was exclusive of separately billable procedures and treating other patients.  Critical care was necessary to treat or prevent imminent or life-threatening deterioration.  Critical care was time spent personally by me on the following activities: development of treatment plan with patient and/or surrogate as well as nursing, discussions with consultants, evaluation of patient's response to treatment, examination of patient, obtaining history from patient or surrogate, ordering and performing treatments and interventions, ordering and review of laboratory studies, ordering and review of radiographic studies, pulse oximetry and re-evaluation of patient's condition.         Final Clinical Impression(s) / ED Diagnoses Final diagnoses:  Hematemesis, unspecified whether nausea present    Rx / DC Orders ED Discharge Orders     None         Kinza Gouveia, Gwenyth Allegra, MD 12/06/21 0522

## 2021-12-06 NOTE — ED Notes (Signed)
AC notified to make drips.

## 2021-12-06 NOTE — ED Triage Notes (Signed)
Pt BIB [facility] from [facility] after pt began to vomit bright red blood this morning. Pt also noted to have some shortness of breath on arrival.

## 2021-12-06 NOTE — Hospital Course (Signed)
81 year old gentleman long-term resident of [facility] with prior history of alcohol abuse and bilateral subdural hematoma, stage IIIb CKD, chronic hyponatremia, gout, anemia, hypertension who was brought into the emergency department by EMS after he began to vomit bright red blood.  He apparently had an EGD with Dr. Jenetta Downer in August 2023 with findings of duodenitis treated with PPI therapy.  No evidence of esophageal varices found.  Patient complained of shortness of breath on arrival and was placed on 5 L nasal cannula but subsequently reduced to 2 L nasal cannula after he settled down.  The ED started him on octreotide infusion and Protonix infusion.  His chest x-ray showed persistent infiltrates in the right mid and lower lung.  There was also a suspicious lesion noted in the right upper lobe lung that the radiologist recommended a CT chest be performed to further evaluate.  The patient is being admitted for further evaluation.

## 2021-12-06 NOTE — ED Notes (Signed)
Attempted report 

## 2021-12-06 NOTE — ED Notes (Signed)
Pt's oxygen turned down to 2 L [place]- pt is less tachypnic and working less.

## 2021-12-06 NOTE — Consult Note (Addendum)
Palliative Care Consult Note                                  Date: 12/06/2021   Patient Name: Anthony Burton  DOB: 11-23-1940  MRN: [id number]  Age / Sex: 81 y.o., male  PCP: Carrolyn Meiers, MD Referring Physician: Murlean Iba, MD  Reason for Consultation: Establishing goals of care  HPI/Patient Profile: 81 y.o. male  with past medical history of alcohol abuse and bilateral subdural hematoma, stage IIIb CKD, chronic hyponatremia, gout, anemia, hypertension who was brought into the emergency department by EMS after he began to vomit bright red blood.  He apparently had an EGD with Dr. Jenetta Downer in August 2023 with findings of duodenitis treated with PPI therapy.  No evidence of esophageal varices found.  He was admitted on 12/06/2021 with hematemesis, right lung middle/lower lung infiltrates and abnormal finding on chest x-ray, CKD stage IIIb, duodenitis, and others.   Follow-up CT scan of the lung found obstructive mass with right lower lobe atelectasis and second spiculated nodule in the right upper lung.  Pulmonary has been consulted and is on board.  There is consideration of transfer to [facility] for possible bronchoscopy.  PMT was consulted for [place] conversations.  Past Medical History:  Diagnosis Date   Alcohol dependence ([place])    Gout    Hypertension    Kidney disease     Subjective:   This NP Walden Field reviewed medical records, received report from team, assessed the patient and then meet at the patient's bedside to discuss diagnosis, prognosis, GOC, EOL wishes disposition and options.  I met with the patient at the bedside, no family was present.  I attempted to call the patient's sister after our meeting to update her but there was no answer and no voicemail to leave a message on.   Concept of Palliative Care was introduced as specialized medical care for people and their families living with serious  illness.  If focuses on providing relief from the symptoms and stress of a serious illness.  The goal is to improve quality of life for both the patient and the family. Values and goals of care important to patient and family were attempted to be elicited.  Created space and opportunity for patient  and family to explore thoughts and feelings regarding current medical situation   Natural trajectory and current clinical status were discussed. Questions and concerns addressed. Patient  encouraged to call with questions or concerns.    Patient/Family Understanding of Illness: He understands that he has been vomiting blood and had shortness of breath.  He knows there is something in his lung and they are not sure what it is.  We had further extensive discussion on CT findings including possibility of lung cancer for which they are considering transfer to [facility] for bronchoscopy.    Goals: DNR, unlikely to elect cancer treatment  Today's Discussion: In addition to discussions described above we had extensive discussion on various topics. The patient appears to have capacity as he is oriented x3 and can explain to me why he is in the hospital.  He also confirmed his understanding of decisions and ramifications of decisions.   We discussed CODE STATUS.  I explained resuscitation as attempts to bring patient back to life when her heart stops no longer stops and may have died.  We discussed his involve CPR, intubation, defibrillation (  all if necessary).  We discussed the risk and benefits these including discomfort, fractured ribs, punctured lung, possible dependency on intubation/ventilator.  We also discussed that this is a 6 regarding survivability and good outcomes/quality of life after resuscitation in hospital.  All of this is in the setting of multiple chronic comorbidities.  After our discussion he states that he does not want resuscitation.  I asked him this several different ways and each time  received the same answer.  He states "if it is my time then let me go."  Additionally we talked about the concerning findings in his lung that could be lung cancer.  We discussed that there is a question of whether to transfer him to [place] long for bronchoscopy.  He states he remembers this conversation with pulmonary today.  He states he would like to "take it as it is."  When I requested further elaboration (clarification to whether he would want bronchoscopy at [facility]) he says "I cannot see doing that."  We discussed that if there is no bronchoscopy for diagnosis then this would likely preclude his ability to get treatment.  I asked if he would ever want cancer treatment such as chemotherapy, surgery, radiation.  He states he does not think he would want this so there is no point in looking.  I again asked him in several different ways and got the same answer.  I confirmed that he understands that this could mean that he would die sooner and he agrees.  He again stated "if it is my time and just let me go."  I also confirmed who he would want his surrogate decision maker to be if he is unable to make his decisions.  He has elected his sister Anthony Burton to be his Media planner.  He agreed for me to contact Anthony and update her on our discussion today.  I explained that it is a good idea for him to talk to her as well about what he does not does not want as he seems pretty confident in his wishes.  I attempted to contact his sister via telephone.  There is no answer and no voicemail machine answered so subsequently a voicemail could not be left.  I provided emotional and general support through therapeutic listening, empathy, sharing of stories, and other techniques. I answered all questions and addressed all concerns to the best of my ability.  Review of Systems  Respiratory:  Negative for shortness of breath.   Gastrointestinal:  Negative for abdominal pain, nausea and vomiting  (hematemesis has resolved for now).    Objective:   Primary Diagnoses: Present on Admission:  Hematemesis  Gout  H/O alcohol abuse  Falls  Hyponatremia  Prediabetes  Abnormality of lung on CXR  Stage 3b chronic kidney disease (CKD) (HCC)  Duodenitis  Elevated lactic acid level  Anemia in chronic kidney disease (CKD)  Infiltrate of lower lobe of right lung present on imaging study  Infiltrate of middle lobe of right lung present on imaging study  Primary lung cancer of unknown cell type   Physical Exam Vitals and nursing note reviewed.  Constitutional:      General: He is not in acute distress.    Appearance: He is ill-appearing.  HENT:     Head: Normocephalic and atraumatic.  Cardiovascular:     Rate and Rhythm: Normal rate.  Pulmonary:     Effort: Pulmonary effort is normal. No respiratory distress.  Abdominal:     General: Abdomen  is flat.     Palpations: Abdomen is soft.     Tenderness: There is no abdominal tenderness.  Skin:    General: Skin is warm and dry.  Neurological:     General: No focal deficit present.     Mental Status: He is oriented to person, place, and time.     Comments: Apparent capacity for decision making  Psychiatric:        Mood and Affect: Mood normal.        Behavior: Behavior normal.     Vital Signs:  BP 129/81   Pulse 80   Temp 98.7 F (37.1 C) (Oral)   Resp 19   Ht 5' 8"  (1.727 m)   Wt 61 kg   SpO2 97%   BMI 20.45 kg/m   Palliative Assessment/Data: 30-40%    Advanced Care Planning:   Primary Decision Maker: PATIENT  Code Status/Advance Care Planning: DNR  A discussion was had today regarding advanced directives. Concepts specific to code status, artifical feeding and hydration, continued IV antibiotics and rehospitalization was had.  The difference between a aggressive medical intervention path and a palliative comfort care path for this patient at this time was had.   Decisions/Changes to ACP: Change to  DNR Would not want chemotherapy, radiation, surgery for cancer  Assessment & Plan:   Impression: 81 year old male with chronic conditions and acute presentations as described above.  We had an extensive discussion today for the concern for obstructive lung mass and lung cancer.  He indicates that this time that he does not want resuscitation.  He also indicates he would not likely want chemotherapy, surgery, radiation and therefore he "cannot see" transfer to [facility] long for bronchoscopy.  He states "if it is my time it is my time."  He seems confident in his decisions, understands the ramifications of these decisions.  Overall prognosis poor.  SUMMARY OF RECOMMENDATIONS   Changed to DNR Communicate to primary medical team and PCCM discussion results Palliative care is not here until Monday Palliative care will follow-up on Monday if patient is still here  Symptom Management:  Per primary team PMT is available to assist as needed  Prognosis:  Unable to determine  Discharge Planning:  To Be Determined   Discussed with: Patient, medical team, nursing team    Thank you for allowing [place] to participate in the care of Anthony Burton PMT will continue to support holistically.  Billing based on MDM: High  Problems Addressed: One acute or chronic illness or injury that poses a threat to life or bodily function  Amount and/or Complexity of Data: Category 3:Discussion of management or test interpretation with external physician/other qualified health care professional/appropriate source (not separately reported)  Risks: Decision not to resuscitate or to de-escalate care because of poor prognosis  Signed by: Walden Field, NP Palliative Medicine Team  Team Phone # [phone] (Nights/Weekends)  12/06/2021, 3:37 PM

## 2021-12-07 DIAGNOSIS — R918 Other nonspecific abnormal finding of lung field: Secondary | ICD-10-CM | POA: Diagnosis not present

## 2021-12-07 DIAGNOSIS — C3490 Malignant neoplasm of unspecified part of unspecified bronchus or lung: Secondary | ICD-10-CM | POA: Diagnosis not present

## 2021-12-07 DIAGNOSIS — R042 Hemoptysis: Secondary | ICD-10-CM | POA: Diagnosis not present

## 2021-12-07 DIAGNOSIS — K920 Hematemesis: Secondary | ICD-10-CM | POA: Diagnosis not present

## 2021-12-07 MED ORDER — BISACODYL 5 MG PO TBEC: 5.0000 mg | DELAYED_RELEASE_TABLET | Freq: Every day | ORAL | 0 refills | Status: AC | PRN

## 2021-12-07 MED ORDER — ACETAMINOPHEN 325 MG PO TABS: 650.0000 mg | ORAL_TABLET | Freq: Four times a day (QID) | ORAL | Status: DC | PRN

## 2021-12-07 MED ORDER — ACETAMINOPHEN 325 MG PO TABS: 650.0000 mg | ORAL_TABLET | Freq: Four times a day (QID) | ORAL | 1 refills | Status: AC | PRN

## 2021-12-07 MED ORDER — OMEPRAZOLE 40 MG PO CPDR: 40.0000 mg | DELAYED_RELEASE_CAPSULE | Freq: Every day | ORAL | 0 refills | Status: DC

## 2021-12-07 MED ORDER — OXYCODONE HCL 5 MG PO TABS: 5.0000 mg | ORAL_TABLET | Freq: Four times a day (QID) | ORAL | 0 refills | Status: AC | PRN

## 2021-12-07 MED ORDER — IPRATROPIUM-ALBUTEROL 0.5-2.5 (3) MG/3ML IN SOLN: 3.0000 mL | RESPIRATORY_TRACT | 1 refills | Status: AC | PRN

## 2021-12-07 MED ORDER — IPRATROPIUM-ALBUTEROL 0.5-2.5 (3) MG/3ML IN SOLN: 3.0000 mL | RESPIRATORY_TRACT | Status: DC | PRN

## 2021-12-07 MED ORDER — BISACODYL 5 MG PO TBEC: 5.0000 mg | DELAYED_RELEASE_TABLET | Freq: Every day | ORAL | 0 refills | Status: DC | PRN

## 2021-12-07 NOTE — TOC Initial Note (Addendum)
Transition of Care [facility]) - Initial/Assessment Note    Patient Details  Name: Anthony Burton MRN: [id number] Date of Birth: [birth year]  [facility] CM/SW Contact:    Ihor Gully, LCSW Phone Number: 12/07/2021, 1:19 PM  Clinical Narrative:                 Patient from [facility]. Per attending patient needs hospice referral as he will return to facility with new dx and is not seeking treatment.  Trudy at facility advises that they use [facility]. Referral made and accepted by [facility] with [facility].  Attempted call to sister to notify of d/c went unanswered.   Expected Discharge Plan: Assisted Living Barriers to Discharge: No Barriers Identified   Patient Goals and CMS Choice        Expected Discharge Plan and Services Expected Discharge Plan: Assisted Living         Expected Discharge Date: 12/07/21                                    Prior Living Arrangements/Services                       Activities of Daily Living Home Assistive Devices/Equipment: None ADL Screening (condition at time of admission) Patient's cognitive ability adequate to safely complete daily activities?: Yes Is the patient deaf or have difficulty hearing?: No Does the patient have difficulty seeing, even when wearing glasses/contacts?: No Does the patient have difficulty concentrating, remembering, or making decisions?: No Patient able to express need for assistance with ADLs?: Yes Does the patient have difficulty dressing or bathing?: No Independently performs ADLs?: Yes (appropriate for developmental age) Does the patient have difficulty walking or climbing stairs?: No Weakness of Legs: None Weakness of Arms/Hands: None  Permission Sought/Granted                  Emotional Assessment              Admission diagnosis:  Hematemesis [K92.0] Hematemesis, unspecified whether nausea present [K92.0] Patient Active Problem List    Diagnosis Date Noted   Hematemesis 12/06/2021   H/O alcohol abuse 12/06/2021   Abnormality of lung on CXR 12/06/2021   Stage 3b chronic kidney disease (CKD) ([place]) 12/06/2021   Duodenitis 12/06/2021   Elevated lactic acid level 12/06/2021   Anemia in chronic kidney disease (CKD) 12/06/2021   Infiltrate of lower lobe of right lung present on imaging study 12/06/2021   Infiltrate of middle lobe of right lung present on imaging study 12/06/2021   Primary lung cancer of unknown cell type 12/06/2021   Iron deficiency anemia 08/21/2021   Hypercalcemia 07/18/2021   Prediabetes 07/18/2021   Hypocortisolemia ([place]) 07/18/2021   AKI (acute kidney injury) ([place]) 07/04/2019   Seizure-like activity ([place]) 07/04/2019   Malnutrition of moderate degree 03/19/2017   Palliative care encounter    Gout 03/17/2017   Alcohol abuse 03/17/2017   Falls 03/17/2017   Weakness 03/17/2017   Hyponatremia 03/17/2017   Elevated troponin 03/17/2017   Bilateral subdural hematomas ([place]) 03/17/2017   Maxillary sinusitis 03/17/2017   PCP:  Carrolyn Meiers, MD Pharmacy:   [id number] - [place], [address] [place] [address] Phone: [phone] Fax: [phone]  [facility] [place], [address]  [place], [place] [address] Phone: [phone] Fax: [phone]     Social Determinants of Health (SDOH) Interventions    Readmission Risk Interventions     No data to display

## 2021-12-07 NOTE — Evaluation (Signed)
Clinical/Bedside Swallow Evaluation Patient Details  Name: Anthony Burton MRN: [id number] Date of Birth: [birth year]  Today's Date: 12/07/2021 Time: SLP Start Time (ACUTE ONLY): 0730 SLP Stop Time (ACUTE ONLY): 0754 SLP Time Calculation (min) (ACUTE ONLY): 24 min  Past Medical History:  Past Medical History:  Diagnosis Date   Alcohol dependence ([place])    Gout    Hypertension    Kidney disease    Past Surgical History:  Past Surgical History:  Procedure Laterality Date   BIOPSY  10/02/2021   Procedure: BIOPSY;  Surgeon: Harvel Quale, MD;  [facility]: [facility];  Service: Gastroenterology;;   COLONOSCOPY WITH PROPOFOL N/A 10/02/2021   Procedure: COLONOSCOPY WITH PROPOFOL;  Surgeon: Harvel Quale, MD;  [facility];  Service: Gastroenterology;  Laterality: N/A;  1015   ESOPHAGOGASTRODUODENOSCOPY (EGD) WITH PROPOFOL N/A 10/02/2021   Procedure: ESOPHAGOGASTRODUODENOSCOPY (EGD) WITH PROPOFOL;  Surgeon: Harvel Quale, MD;  [facility];  Service: Gastroenterology;  Laterality: N/A;   POLYPECTOMY  10/02/2021   Procedure: POLYPECTOMY;  Surgeon: Harvel Quale, MD;  [facility]: [facility];  Service: Gastroenterology;;   HPI:  Anthony Burton is a 81 year old gentleman long-term resident of [facility] with prior history of alcohol abuse and bilateral subdural hematoma, stage IIIb CKD, chronic hyponatremia, gout, anemia, hypertension who was brought into the emergency department by EMS after he began to vomit bright red blood.  He apparently had an EGD with Dr. Jenetta Downer in August 2023 with findings of duodenitis treated with PPI therapy.  No evidence of esophageal varices found.  Patient complained of shortness of breath on arrival and was placed on 5 L nasal cannula but subsequently reduced to 2 L nasal cannula after he settled down.  The ED started him on octreotide infusion and Protonix infusion.  His chest x-ray showed  persistent infiltrates in the right mid and lower lung.  There was also a suspicious lesion noted in the right upper lobe lung that the radiologist recommended a CT chest be performed to further evaluate.  The patient is being admitted for further evaluation. Right lung middle/lower lung infiltrates / Abnormal finding on CXR, concern for aspiration. BSE requested.    Assessment / Plan / Recommendation  Clinical Impression  Clinical swallowing evaluation completed while Pt was sitting upright in bed; Pt consumed thin liquids, regular textures and puree textures without overt s/sx of aspiration. Note abnormal findings from recent CXR and Pt decision (per Palliative note yesterday) to not be transferred for further assessment/treatment. Pt confirms with me that he wants to "eat whatever he wants" at this time. Recommend upgrade Pt's diet to regular textures and continue with thin liquids. If goals of care indicate that MBSS would be of benefit to r/o ? Silent aspiration please order, however, at this time it does not seem indicated per Pt clearly and kindly communicating that he does not want a diet adjustment or swallowing interventions. ST will continue to follow up X1 to ensure diet tolerance and for goals of care, thank you, SLP Visit Diagnosis: Dysphagia, unspecified (R13.10)    Aspiration Risk       Diet Recommendation Regular;Thin liquid   Liquid Administration via: Cup;Straw Medication Administration: Whole meds with liquid Supervision: Patient able to self feed Postural Changes: Seated upright at 90 degrees    Other  Recommendations Oral Care Recommendations: Oral care BID    Recommendations for follow up therapy are one component of a multi-disciplinary discharge planning process, led by  the attending physician.  Recommendations may be updated based on patient status, additional functional criteria and insurance authorization.  Follow up Recommendations No SLP follow up             Frequency and Duration min 1 x/week  1 week       Prognosis Prognosis for Safe Diet Advancement: Fair      Swallow Study   General HPI: Anthony Burton is a 81 year old gentleman long-term resident of [facility] with prior history of alcohol abuse and bilateral subdural hematoma, stage IIIb CKD, chronic hyponatremia, gout, anemia, hypertension who was brought into the emergency department by EMS after he began to vomit bright red blood.  He apparently had an EGD with Dr. Jenetta Downer in August 2023 with findings of duodenitis treated with PPI therapy.  No evidence of esophageal varices found.  Patient complained of shortness of breath on arrival and was placed on 5 L nasal cannula but subsequently reduced to 2 L nasal cannula after he settled down.  The ED started him on octreotide infusion and Protonix infusion.  His chest x-ray showed persistent infiltrates in the right mid and lower lung.  There was also a suspicious lesion noted in the right upper lobe lung that the radiologist recommended a CT chest be performed to further evaluate.  The patient is being admitted for further evaluation. Right lung middle/lower lung infiltrates / Abnormal finding on CXR, concern for aspiration. BSE requested. Type of Study: Bedside Swallow Evaluation Previous Swallow Assessment: none in chart Diet Prior to this Study: Dysphagia 2 (chopped);Thin liquids Temperature Spikes Noted: No Respiratory Status: Nasal cannula History of Recent Intubation: No Behavior/Cognition: Alert;Cooperative;Pleasant mood Oral Cavity Assessment: Within Functional Limits Oral Cavity - Dentition: Adequate natural dentition;Missing dentition Vision: Functional for self-feeding Self-Feeding Abilities: Able to feed self Patient Positioning: Upright in bed Baseline Vocal Quality: Normal Volitional Cough: Strong Volitional Swallow: Able to elicit    Oral/Motor/Sensory Function Overall Oral Motor/Sensory Function: Within functional  limits   Ice Chips Ice chips: Within functional limits   Thin Liquid Thin Liquid: Within functional limits    Nectar Thick Nectar Thick Liquid: Not tested   Honey Thick Honey Thick Liquid: Not tested   Puree Puree: Within functional limits   Solid     Solid: Within functional limits     Cielo Arias H. Roddie Mc, CCC-SLP Speech Language Pathologist  Wende Bushy 12/07/2021,8:02 AM

## 2021-12-07 NOTE — NC FL2 (Signed)
[place] LEVEL OF CARE SCREENING TOOL     IDENTIFICATION  Patient Name: Anthony Burton Birthdate: 03/04/1940 Sex: male Admission Date (Current Location): 12/06/2021  [place] and [place] Number:  [facility] [phone] [place] and Address:  [facility] [place] [address], [place]      Provider Number: [id number]  Attending Physician Name and Address:  Murlean Iba, MD  Relative Name and Phone Number:  Mia [place] (Sister)   [phone]    Current Level of Care: Hospital (obs) Recommended Level of Care: [place] Prior Approval Number:    Date Approved/Denied:   PASRR Number:    Discharge Plan: Domiciliary (Rest home) (ALF)    Current Diagnoses: Patient Active Problem List   Diagnosis Date Noted   Hematemesis 12/06/2021   H/O alcohol abuse 12/06/2021   Abnormality of lung on CXR 12/06/2021   Stage 3b chronic kidney disease (CKD) ([place]) 12/06/2021   Duodenitis 12/06/2021   Elevated lactic acid level 12/06/2021   Anemia in chronic kidney disease (CKD) 12/06/2021   Infiltrate of lower lobe of right lung present on imaging study 12/06/2021   Infiltrate of middle lobe of right lung present on imaging study 12/06/2021   Primary lung cancer of unknown cell type 12/06/2021   Iron deficiency anemia 08/21/2021   Hypercalcemia 07/18/2021   Prediabetes 07/18/2021   Hypocortisolemia ([place]) 07/18/2021   AKI (acute kidney injury) ([place]) 07/04/2019   Seizure-like activity ([place]) 07/04/2019   Malnutrition of moderate degree 03/19/2017   Palliative care encounter    Gout 03/17/2017   Alcohol abuse 03/17/2017   Falls 03/17/2017   Weakness 03/17/2017   Hyponatremia 03/17/2017   Elevated troponin 03/17/2017   Bilateral subdural hematomas ([facility]) 03/17/2017   Maxillary sinusitis 03/17/2017    Orientation RESPIRATION BLADDER Height & Weight     Self, Situation, Place  Normal Continent Weight: 134 lb 7.7 oz (61  kg) Height:  5\' 8"  (172.7 cm)  BEHAVIORAL SYMPTOMS/MOOD NEUROLOGICAL BOWEL NUTRITION STATUS      Continent Diet (regular)  AMBULATORY STATUS COMMUNICATION OF NEEDS Skin   Limited Assist Verbally Normal                       Personal Care Assistance Level of Assistance  Bathing, Feeding, Dressing Bathing Assistance: Limited assistance Feeding assistance: Independent Dressing Assistance: Limited assistance     Functional Limitations Info  Sight, Hearing, Speech Sight Info: Adequate Hearing Info: Adequate Speech Info: Adequate    SPECIAL CARE FACTORS FREQUENCY                       Contractures Contractures Info: Not present    Additional Factors Info  Code Status, Allergies Code Status Info: DNR Allergies Info: NKA           Current Medications (12/07/2021):  This is the current hospital active medication list Current Facility-Administered Medications  Medication Dose Route Frequency Provider Last Rate Last Admin   acetaminophen (TYLENOL) tablet 650 mg  650 mg Oral Q6H PRN Johnson, Clanford L, MD       Or   acetaminophen (TYLENOL) suppository 650 mg  650 mg Rectal Q6H PRN Johnson, Clanford L, MD       azithromycin (ZITHROMAX) 500 mg in sodium chloride 0.9 % 250 mL IVPB  500 mg Intravenous Q24H Johnson, Clanford L, MD   Stopped at 12/07/21 1142   bisacodyl (DULCOLAX) EC tablet 5 mg  5 mg Oral Daily  PRN Johnson, Clanford L, MD       cefTRIAXone (ROCEPHIN) 2 g in sodium chloride 0.9 % 100 mL IVPB  2 g Intravenous Q24H Madueme, Elvira C, RPH   Stopped at 12/07/21 1142   ipratropium-albuterol (DUONEB) 0.5-2.5 (3) MG/3ML nebulizer solution 3 mL  3 mL Nebulization TID Wynetta Emery, Clanford L, MD   3 mL at 12/07/21 0855   metoprolol tartrate (LOPRESSOR) tablet 25 mg  25 mg Oral BID Johnson, Clanford L, MD   25 mg at 12/07/21 0848   mupirocin ointment (BACTROBAN) 2 % 1 Application  1 Application Nasal BID Wynetta Emery, Clanford L, MD   1 Application at 02/77/41 0850   nicotine  (NICODERM CQ - dosed in mg/24 hours) patch 21 mg  21 mg Transdermal Daily PRN Johnson, Clanford L, MD       ondansetron (ZOFRAN) tablet 4 mg  4 mg Oral Q6H PRN Johnson, Clanford L, MD       Or   ondansetron (ZOFRAN) injection 4 mg  4 mg Intravenous Q6H PRN Johnson, Clanford L, MD       Oral care mouth rinse  15 mL Mouth Rinse PRN Johnson, Clanford L, MD       oxyCODONE (Oxy IR/ROXICODONE) immediate release tablet 5 mg  5 mg Oral Q6H PRN Johnson, Clanford L, MD       pantoprazole (PROTONIX) EC tablet 40 mg  40 mg Oral Q0600 Johnson, Clanford L, MD   40 mg at 12/07/21 0515     Discharge Medications: TAKE these medications     acetaminophen 325 MG tablet Commonly known as: TYLENOL Take 2 tablets (650 mg total) by mouth every 6 (six) hours as needed for mild pain, fever or headache (or Fever >/= 101).    bisacodyl 5 MG EC tablet Commonly known as: DULCOLAX Take 1 tablet (5 mg total) by mouth daily as needed for moderate constipation.    chlorhexidine 0.12 % solution Commonly known as: PERIDEX 15 mLs by Mouth Rinse route 2 (two) times daily.    folic acid 1 MG tablet Commonly known as: FOLVITE Take 1 tablet (1 mg total) by mouth daily.    ipratropium-albuterol 0.5-2.5 (3) MG/3ML Soln Commonly known as: DUONEB Take 3 mLs by nebulization every 4 (four) hours as needed (WHEEZING, COUGHING, SHORTNESS OF BREATH).    metoprolol tartrate 25 MG tablet Commonly known as: LOPRESSOR Take 25 mg by mouth 2 (two) times daily.    omeprazole 40 MG capsule Commonly known as: PRILOSEC Take 1 capsule (40 mg total) by mouth daily for 14 days. After finishing, go down to once a day dosing. What changed: when to take this    oxyCODONE 5 MG immediate release tablet Commonly known as: Oxy IR/ROXICODONE Take 1 tablet (5 mg total) by mouth every 6 (six) hours as needed for up to 3 days for severe pain.           Relevant Imaging Results:  Relevant Lab Results:   Additional Information     Elsey Holts, Clydene Pugh, LCSW

## 2021-12-07 NOTE — Discharge Instructions (Signed)
PLEASE CALL HOSPICE NURSE FIRST BEFORE CALLING 911 OR SENDING TO HOSPITAL.  WE ANTICIPATE HE WILL HAVE MORE EPISODES OF COUGHING UP BLOOD DUE TO THE LUNG CANCER.  PLEASE CALL HOSPICE NURSE FIRST IF HE HAS RECURRENCE OF THESE SYMPTOMS.

## 2021-12-07 NOTE — Discharge Summary (Signed)
Physician Discharge Summary  ANDREE GOLPHIN UKG:[id number] DOB: 1940/11/22 DOA: 12/06/2021  PCP: Carrolyn Meiers, MD  Admit date: 12/06/2021 Discharge date: 12/07/2021  Admitted From:  [facility]  Disposition:  [facility]   Recommendations for Outpatient Follow-up:  PLEASE CALL HOSPICE NURSE FIRST BEFORE CALLING [place].   WE ANTICIPATE HE WILL HAVE MORE EPISODES OF COUGHING UP BLOOD DUE TO THE LUNG CANCER.  PLEASE CALL HOSPICE NURSE FIRST IF HE HAS RECURRENCE OF THESE SYMPTOMS.      Discharge Condition: HOSPICE / TERMINAL   CODE STATUS: DNR DIET: REGULAR    Brief Hospitalization Summary: Please see all hospital notes, images, labs for full details of the hospitalization. 81 year old gentleman long-term resident of [facility] with prior history of alcohol abuse and bilateral subdural hematoma, stage IIIb CKD, chronic hyponatremia, gout, anemia, hypertension who was brought into the emergency department by EMS after he began to vomit bright red blood.  He apparently had an EGD with Dr. Jenetta Downer in August 2023 with findings of duodenitis treated with PPI therapy.  No evidence of esophageal varices found.  Patient complained of shortness of breath on arrival and was placed on 5 L nasal cannula but subsequently reduced to 2 L nasal cannula after he settled down.  The ED started him on octreotide infusion and Protonix infusion.  His chest x-ray showed persistent infiltrates in the right mid and lower lung.  There was also a suspicious lesion noted in the right upper lobe lung that the radiologist recommended a CT chest be performed to further evaluate.  The patient is being admitted for further evaluation.  HOSPITAL COURSE  This 81 year old gentleman was admitted from [facility] long-term care facility with symptoms of acute onset hemoptysis that has resolved at this time.  Further work-up is revealed an obstructive mass in the right lower lobe and spiculated  nodule in the right upper lobe strongly suspected to be primary lung cancer.  Patient was seen by the inpatient pulmonologist and also by the palliative medicine service in the hospital and he made a decision that he did not want to pursue further work-up or treatment for this and that he excepted the diagnosis and agreeable to having hospice care benefits.  He would like to return to his long term care facility with hospice benefits and comfort measures.  He is not having any pain at this time.  He is breathing well and not having respiratory difficulties.  He likely will have further episodes of hemoptysis given that there is some tumor involvement in the bronchus with invasion.  Please call the hospice nurse first if he has developed recurrent symptoms of hemoptysis or shortness of breath and they can handle and treat his symptoms appropriately.  Patient is now DNR.  Arrangements have been made for hospice services to treat him at his long-term care facility.  Discharge Diagnoses:  Principal Problem:   Hematemesis Active Problems:   Abnormality of lung on CXR   Primary lung cancer of unknown cell type   Gout   Falls   Weakness   Hyponatremia   Prediabetes   H/O alcohol abuse   Stage 3b chronic kidney disease (CKD) (HCC)   Duodenitis   Elevated lactic acid level   Anemia in chronic kidney disease (CKD)   Infiltrate of lower lobe of right lung present on imaging study   Infiltrate of middle lobe of right lung present on imaging study   Discharge Instructions:  Allergies as of  12/07/2021   No Known Allergies      Medication List     STOP taking these medications    Bismuth 262 MG Chew   ibuprofen 800 MG tablet Commonly known as: ADVIL   thiamine 100 MG tablet Commonly known as: VITAMIN B1       TAKE these medications    acetaminophen 325 MG tablet Commonly known as: TYLENOL Take 2 tablets (650 mg total) by mouth every 6 (six) hours as needed for mild pain, fever or  headache (or Fever >/= 101).   bisacodyl 5 MG EC tablet Commonly known as: DULCOLAX Take 1 tablet (5 mg total) by mouth daily as needed for moderate constipation.   chlorhexidine 0.12 % solution Commonly known as: PERIDEX 15 mLs by Mouth Rinse route 2 (two) times daily.   folic acid 1 MG tablet Commonly known as: FOLVITE Take 1 tablet (1 mg total) by mouth daily.   ipratropium-albuterol 0.5-2.5 (3) MG/3ML Soln Commonly known as: DUONEB Take 3 mLs by nebulization every 4 (four) hours as needed (WHEEZING, COUGHING, SHORTNESS OF BREATH).   metoprolol tartrate 25 MG tablet Commonly known as: LOPRESSOR Take 25 mg by mouth 2 (two) times daily.   omeprazole 40 MG capsule Commonly known as: PRILOSEC Take 1 capsule (40 mg total) by mouth daily for 14 days. After finishing, go down to once a day dosing. What changed: when to take this   oxyCODONE 5 MG immediate release tablet Commonly known as: Oxy IR/ROXICODONE Take 1 tablet (5 mg total) by mouth every 6 (six) hours as needed for up to 3 days for severe pain.        No Known Allergies Allergies as of 12/07/2021   No Known Allergies      Medication List     STOP taking these medications    Bismuth 262 MG Chew   ibuprofen 800 MG tablet Commonly known as: ADVIL   thiamine 100 MG tablet Commonly known as: VITAMIN B1       TAKE these medications    acetaminophen 325 MG tablet Commonly known as: TYLENOL Take 2 tablets (650 mg total) by mouth every 6 (six) hours as needed for mild pain, fever or headache (or Fever >/= 101).   bisacodyl 5 MG EC tablet Commonly known as: DULCOLAX Take 1 tablet (5 mg total) by mouth daily as needed for moderate constipation.   chlorhexidine 0.12 % solution Commonly known as: PERIDEX 15 mLs by Mouth Rinse route 2 (two) times daily.   folic acid 1 MG tablet Commonly known as: FOLVITE Take 1 tablet (1 mg total) by mouth daily.   ipratropium-albuterol 0.5-2.5 (3) MG/3ML  Soln Commonly known as: DUONEB Take 3 mLs by nebulization every 4 (four) hours as needed (WHEEZING, COUGHING, SHORTNESS OF BREATH).   metoprolol tartrate 25 MG tablet Commonly known as: LOPRESSOR Take 25 mg by mouth 2 (two) times daily.   omeprazole 40 MG capsule Commonly known as: PRILOSEC Take 1 capsule (40 mg total) by mouth daily for 14 days. After finishing, go down to once a day dosing. What changed: when to take this   oxyCODONE 5 MG immediate release tablet Commonly known as: Oxy IR/ROXICODONE Take 1 tablet (5 mg total) by mouth every 6 (six) hours as needed for up to 3 days for severe pain.        Procedures/Studies: CT CHEST WO CONTRAST  Result Date: 12/06/2021 CLINICAL DATA:  Abnormal chest radiograph, hematemesis * Tracking Code: BO * EXAM: CT CHEST  WITHOUT CONTRAST TECHNIQUE: Multidetector CT imaging of the chest was performed following the standard protocol without IV contrast. RADIATION DOSE REDUCTION: This exam was performed according to the departmental dose-optimization program which includes automated exposure control, adjustment of the mA and/or kV according to patient size and/or use of iterative reconstruction technique. COMPARISON:  Same-day chest radiographs FINDINGS: Cardiovascular: Aortic atherosclerosis. Normal heart size. No pericardial effusion. Mediastinum/Nodes: Enlarged pretracheal, AP window, and right superior mediastinal lymph nodes, largest pretracheal nodes measuring up to 1.9 x 1.4 cm (series 2, image 31). Thyroid gland, trachea, and esophagus demonstrate no significant findings. Lungs/Pleura: Severe centrilobular and paraseptal emphysema. Spiculated mass in the apical right upper lobe measuring 3.9 x 2.9 cm (series 3, image 47). Additional cavitary, spiculated mass or bulky hilar nodal metastasis of the infrahilar superior segment right lower lobe, which is difficult to confidently distinguish from adjacent airspace disease but measures at least 5.9 x  4.7 cm (series 3, image 42). This appears to frankly obstruct the bronchus intermedius. Extensive heterogeneous airspace disease of the inferior right upper lobe and right lower lobe, with extensive postobstructive atelectasis or consolidation of the right lung base. Small right pleural effusion. Scattered bronchiolar plugging and heterogeneous airspace disease in the left lung base. Upper Abdomen: No acute abnormality. Left adrenal nodule measuring 1.4 x 1.4 cm (series 2, image 68). Musculoskeletal: No chest wall abnormality. No acute osseous findings. IMPRESSION: 1. Spiculated mass in the apical right upper lobe measuring 3.9 x 2.9 cm. 2. Additional cavitary, spiculated mass or bulky hilar nodal metastasis of the infrahilar superior segment right lower lobe, which is difficult to confidently distinguish from adjacent airspace disease but measures at least 5.9 x 4.7 cm. This appears to frankly obstruct the bronchus intermedius. 3. Extensive heterogeneous airspace disease of the inferior right upper lobe and right lower lobe, with extensive postobstructive atelectasis or consolidation of the right lung base. 4. Small right pleural effusion. 5. Enlarged pretracheal, AP window, and right superior mediastinal lymph nodes. 6. Left adrenal nodule measuring 1.4 x 1.4 cm, nonspecific although modestly suspicious for metastatic disease. 7. Constellation of findings is consistent with primary lung malignancy; primary lesion within the right lung unclear as either mass described may reflect a primary, metastasis, or synchronous primary. 8. Emphysema. Aortic Atherosclerosis (ICD10-I70.0) and Emphysema (ICD10-J43.9). Electronically Signed   By: Delanna Ahmadi M.D.   On: 12/06/2021 09:02   DG Chest Port 1 View  Result Date: 12/06/2021 CLINICAL DATA:  Hematemesis EXAM: PORTABLE CHEST 1 VIEW COMPARISON:  08/17/2021 FINDINGS: Cardiac shadow is stable. Lungs are well aerated bilaterally. Diffuse airspace opacity is noted in the  mid and lower right lung consistent with focal infiltrate. No sizable effusion is seen. Stable architectural distortion in the right upper lobe is seen. No bony abnormality is noted. IMPRESSION: Persistent architectural distortion in the right upper lobe. Again CT is recommended for further evaluation. Significant increase in right mid and lower lung infiltrate. Electronically Signed   By: Inez Catalina M.D.   On: 12/06/2021 04:05     Subjective: No further hemoptysis, no pain or discomfort, he is agreeable to receiving hospice benefits at LTC facility.    Discharge Exam: Vitals:   12/07/21 0607 12/07/21 0855  BP: 135/81   Pulse: 86   Resp: 18   Temp: 97.8 F (36.6 C)   SpO2: 98% 99%   Vitals:   12/06/21 1917 12/06/21 2131 12/07/21 0607 12/07/21 0855  BP:  121/80 135/81   Pulse:  92 86   Resp:  20 18   Temp:  98.4 F (36.9 C) 97.8 F (36.6 C)   TempSrc:  Oral Oral   SpO2: 99% 100% 98% 99%  Weight:      Height:       General: Pt is alert, awake, not in acute distress Cardiovascular: normal S1/S2 +, no rubs, no gallops Respiratory: CTA bilaterally, no wheezing, no rhonchi Abdominal: Soft, NT, ND, bowel sounds + Extremities: no edema, no cyanosis   The results of significant diagnostics from this hospitalization (including imaging, microbiology, ancillary and laboratory) are listed below for reference.     Microbiology: Recent Results (from the past 240 hour(s))  MRSA Next Gen by PCR, Nasal     Status: Abnormal   Collection Time: 12/06/21  8:22 AM   Specimen: Nasal Mucosa; Nasal Swab  Result Value Ref Range Status   MRSA by PCR Next Gen DETECTED (A) NOT DETECTED Final    Comment: RESULT CALLED TO, READ BACK BY AND VERIFIED WITH: BRITTANY FOLEY,RN @1028  BY SSLANE (NOTE) The GeneXpert MRSA Assay (FDA approved for NASAL specimens only), is one component of a comprehensive MRSA colonization surveillance program. It is not intended to diagnose MRSA infection nor to guide or  monitor treatment for MRSA infections. Test performance is not FDA approved in patients less than 88 years old. Performed at [facility], [address], [place], [place] [address]      Labs: BNP (last 3 results) Recent Labs    08/17/21 0156 12/06/21 0343  BNP 55.0 63.8   Basic Metabolic Panel: Recent Labs  Lab 12/06/21 0343  NA 130*  K 4.1  CL 97*  CO2 24  GLUCOSE 156*  BUN 22  CREATININE 1.60*  CALCIUM 9.1   Liver Function Tests: Recent Labs  Lab 12/06/21 0343  AST 17  ALT 10  ALKPHOS 86  BILITOT 0.6  PROT 8.5*  ALBUMIN 3.5   Recent Labs  Lab 12/06/21 0343  LIPASE 39   Recent Labs  Lab 12/06/21 0343  AMMONIA 13   CBC: Recent Labs  Lab 12/06/21 0343  WBC 7.6  NEUTROABS 4.5  HGB 11.5*  HCT 36.4*  MCV 80.0  PLT 305   Cardiac Enzymes: No results for input(s): "CKTOTAL", "CKMB", "CKMBINDEX", "TROPONINI" in the last 168 hours. BNP: Invalid input(s): "POCBNP" CBG: No results for input(s): "GLUCAP" in the last 168 hours. D-Dimer No results for input(s): "DDIMER" in the last 72 hours. Hgb A1c No results for input(s): "HGBA1C" in the last 72 hours. Lipid Profile No results for input(s): "CHOL", "HDL", "LDLCALC", "TRIG", "CHOLHDL", "LDLDIRECT" in the last 72 hours. Thyroid function studies No results for input(s): "TSH", "T4TOTAL", "T3FREE", "THYROIDAB" in the last 72 hours.  Invalid input(s): "FREET3" Anemia work up No results for input(s): "VITAMINB12", "FOLATE", "FERRITIN", "TIBC", "IRON", "RETICCTPCT" in the last 72 hours. Urinalysis    Component Value Date/Time   COLORURINE YELLOW 07/03/2019 2110   APPEARANCEUR CLEAR 07/03/2019 2110   LABSPEC 1.008 07/03/2019 2110   PHURINE 6.0 07/03/2019 2110   GLUCOSEU NEGATIVE 07/03/2019 2110   HGBUR MODERATE (A) 07/03/2019 2110   BILIRUBINUR NEGATIVE 07/03/2019 2110   KETONESUR NEGATIVE 07/03/2019 2110   PROTEINUR NEGATIVE 07/03/2019 2110   NITRITE NEGATIVE 07/03/2019 2110   LEUKOCYTESUR  MODERATE (A) 07/03/2019 2110   Sepsis Labs Recent Labs  Lab 12/06/21 0343  WBC 7.6   Microbiology Recent Results (from the past 240 hour(s))  MRSA Next Gen by PCR, Nasal     Status: Abnormal   Collection Time: 12/06/21  8:22 AM   Specimen: Nasal Mucosa; Nasal Swab  Result Value Ref Range Status   MRSA by PCR Next Gen DETECTED (A) NOT DETECTED Final    Comment: RESULT CALLED TO, READ BACK BY AND VERIFIED WITH: BRITTANY FOLEY,RN @1028  BY SSLANE (NOTE) The GeneXpert MRSA Assay (FDA approved for NASAL specimens only), is one component of a comprehensive MRSA colonization surveillance program. It is not intended to diagnose MRSA infection nor to guide or monitor treatment for MRSA infections. Test performance is not FDA approved in patients less than 79 years old. Performed at [facility], [address] [place], [place] [address]    Time coordinating discharge: 35 mins   SIGNED:  Irwin Brakeman, MD  [facility] 12/07/2021, 11:26 AM How to contact the [facility] Attending or Consulting provider [place] or covering provider during after hours [place], for this patient?  Check the care team in [facility] and look for a) attending/consulting [facility] provider listed and b) the [facility] team listed Log into [email or web address] and use [place]'s universal password to access. If you do not have the password, please contact the hospital operator. Locate the [facility] provider you are looking for under [facility] and page to a number that you can be directly reached. If you still have difficulty reaching the provider, please page the [facility] (Director on Call) for the Hospitalists listed on amion for assistance.

## 2021-12-21 ENCOUNTER — Telehealth (INDEPENDENT_AMBULATORY_CARE_PROVIDER_SITE_OTHER): Payer: Self-pay | Admitting: *Deleted

## 2021-12-21 NOTE — Telephone Encounter (Signed)
Thanks for the update

## 2021-12-21 NOTE — Telephone Encounter (Addendum)
spoke to receptionist at [facility], patient is in an assisted living facility, they have left a couple message for the facility to call and schedule apt and as of today no respond from facility

## 2021-12-24 ENCOUNTER — Other Ambulatory Visit (INDEPENDENT_AMBULATORY_CARE_PROVIDER_SITE_OTHER): Payer: Self-pay | Admitting: Gastroenterology

## 2021-12-24 ENCOUNTER — Telehealth (INDEPENDENT_AMBULATORY_CARE_PROVIDER_SITE_OTHER): Payer: Self-pay | Admitting: *Deleted

## 2021-12-24 DIAGNOSIS — B9681 Helicobacter pylori [H. pylori] as the cause of diseases classified elsewhere: Secondary | ICD-10-CM

## 2021-12-24 MED ORDER — OMEPRAZOLE 40 MG PO CPDR: 40.0000 mg | DELAYED_RELEASE_CAPSULE | Freq: Every day | ORAL | 1 refills | Status: DC

## 2021-12-24 NOTE — Telephone Encounter (Signed)
[name] from [facility] called asking if patient needed to continue omeprazole 40mg  one daily. Has been taking. Out of med. Reports pharmacy has been trying to contact us. This med was prescribed by Dr. Irwin Brakeman and we have not received anything from pharmacy that I can see. He was last seen 08/21/21.   [facility]ick [phone]

## 2021-12-25 ENCOUNTER — Other Ambulatory Visit (INDEPENDENT_AMBULATORY_CARE_PROVIDER_SITE_OTHER): Payer: Self-pay | Admitting: *Deleted

## 2021-12-25 DIAGNOSIS — B9681 Helicobacter pylori [H. pylori] as the cause of diseases classified elsewhere: Secondary | ICD-10-CM

## 2021-12-25 MED ORDER — OMEPRAZOLE 40 MG PO CPDR: 40.0000 mg | DELAYED_RELEASE_CAPSULE | Freq: Every day | ORAL | 1 refills | Status: AC

## 2021-12-25 NOTE — Telephone Encounter (Signed)
Discussed with assistance living facility that med was sent to pharmacy and I gave them the phone number to [place] ent [phone] to call to set up appt since note in epic states unable to get in touch with pt to schedule.

## 2022-01-01 LAB — COMPREHENSIVE METABOLIC PANEL
ALT: 8 IU/L (ref 0–44)
AST: 14 IU/L (ref 0–40)
Albumin/Globulin Ratio: 0.8 — ABNORMAL LOW (ref 1.2–2.2)
Albumin: 3.9 g/dL (ref 3.8–4.8)
Alkaline Phosphatase: 108 IU/L (ref 44–121)
BUN/Creatinine Ratio: 12 (ref 10–24)
BUN: 21 mg/dL (ref 8–27)
Bilirubin Total: 0.4 mg/dL (ref 0.0–1.2)
CO2: 24 mmol/L (ref 20–29)
Calcium: 9.7 mg/dL (ref 8.6–10.2)
Chloride: 96 mmol/L (ref 96–106)
Creatinine, Ser: 1.76 mg/dL — ABNORMAL HIGH (ref 0.76–1.27)
Globulin, Total: 4.9 g/dL — ABNORMAL HIGH (ref 1.5–4.5)
Glucose: 107 mg/dL — ABNORMAL HIGH (ref 70–99)
Potassium: 5.2 mmol/L (ref 3.5–5.2)
Sodium: 133 mmol/L — ABNORMAL LOW (ref 134–144)
Total Protein: 8.8 g/dL — ABNORMAL HIGH (ref 6.0–8.5)
eGFR: 39 mL/min/{1.73_m2} — ABNORMAL LOW (ref >59)

## 2022-01-01 LAB — PTH, INTACT AND CALCIUM: PTH: 19 pg/mL (ref 15–65)

## 2022-01-01 LAB — MAGNESIUM: Magnesium: 1.7 mg/dL (ref 1.6–2.3)

## 2022-01-01 LAB — PHOSPHORUS: Phosphorus: 3.2 mg/dL (ref 2.8–4.1)

## 2022-01-07 ENCOUNTER — Encounter: Payer: Self-pay | Admitting: "Endocrinology

## 2022-01-07 ENCOUNTER — Ambulatory Visit (INDEPENDENT_AMBULATORY_CARE_PROVIDER_SITE_OTHER): Payer: Medicare Other | Admitting: "Endocrinology

## 2022-01-07 DIAGNOSIS — R7303 Prediabetes: Secondary | ICD-10-CM

## 2022-01-07 DIAGNOSIS — E2749 Other adrenocortical insufficiency: Secondary | ICD-10-CM | POA: Diagnosis not present

## 2022-01-07 NOTE — Progress Notes (Signed)
01/07/2022, 6:16 PM   Endocrinology follow-up note  Subjective:    Patient ID: Anthony Burton, male    DOB: 23-Nov-1940, PCP Carrolyn Meiers, MD   Past Medical History:  Diagnosis Date   Alcohol dependence ([place])    Gout    Hypertension    Kidney disease    Past Surgical History:  Procedure Laterality Date   BIOPSY  10/02/2021   Procedure: BIOPSY;  Surgeon: Harvel Quale, MD;  [facility];  Service: Gastroenterology;;   COLONOSCOPY WITH PROPOFOL N/A 10/02/2021   Procedure: COLONOSCOPY WITH PROPOFOL;  Surgeon: Harvel Quale, MD;  [facility];  Service: Gastroenterology;  Laterality: N/A;  1015   ESOPHAGOGASTRODUODENOSCOPY (EGD) WITH PROPOFOL N/A 10/02/2021   Procedure: ESOPHAGOGASTRODUODENOSCOPY (EGD) WITH PROPOFOL;  Surgeon: Harvel Quale, MD;  [facility];  Service: Gastroenterology;  Laterality: N/A;   POLYPECTOMY  10/02/2021   Procedure: POLYPECTOMY;  Surgeon: Harvel Quale, MD;  [facility];  Service: Gastroenterology;;   Social History   Socioeconomic History   Marital status: Widowed    Spouse name: Not on file   Number of children: Not on file   Years of education: Not on file   Highest education level: Not on file  Occupational History   Not on file  Tobacco Use   Smoking status: Every Day    Types: Cigarettes    Passive exposure: Current   Smokeless tobacco: Never  Vaping Use   Vaping Use: Never used  Substance and Sexual Activity   Alcohol use: Not Currently    Comment: daily use   Drug use: No   Sexual activity: Not on file  Other Topics Concern   Not on file  Social History Narrative   Not on file   Social Determinants of Health   Financial Resource Strain: Not on file  Food Insecurity: No Food Insecurity (12/06/2021)   Hunger Vital Sign    Worried About Running Out of Food in the Last Year:  Never true    Ran Out of Food in the Last Year: Never true  Transportation Needs: No Transportation Needs (12/06/2021)   PRAPARE - Hydrologist (Medical): No    Lack of Transportation (Non-Medical): No  Physical Activity: Not on file  Stress: Not on file  Social Connections: Not on file   History reviewed. No pertinent family history. Outpatient Encounter Medications as of 01/07/2022  Medication Sig   acetaminophen (TYLENOL) 325 MG tablet Take 2 tablets (650 mg total) by mouth every 6 (six) hours as needed for mild pain, fever, headache or moderate pain (or Fever >/= 101).   bisacodyl (DULCOLAX) 5 MG EC tablet Take 1 tablet (5 mg total) by mouth daily as needed for moderate constipation.   chlorhexidine (PERIDEX) 0.12 % solution 15 mLs by Mouth Rinse route 2 (two) times daily.   folic acid (FOLVITE) 1 MG tablet Take 1 tablet (1 mg total) by mouth daily.   ipratropium-albuterol (DUONEB) 0.5-2.5 (3) MG/3ML SOLN Take 3 mLs by nebulization every 4 (four) hours as needed (WHEEZING, COUGHING, SHORTNESS OF BREATH).   metoprolol tartrate (LOPRESSOR) 25 MG tablet Take 25 mg by  mouth 2 (two) times daily.   omeprazole (PRILOSEC) 40 MG capsule Take 1 capsule (40 mg total) by mouth daily.   No facility-administered encounter medications on file as of 01/07/2022.   ALLERGIES: No Known Allergies  VACCINATION STATUS: Immunization History  Administered Date(s) Administered   Influenza, High Dose Seasonal PF 03/18/2017   Pneumococcal Polysaccharide-23 03/18/2017   Tdap 03/16/2015    HPI Anthony Burton is 81 y.o. male who presents today with a medical history as above. He is being seen in follow-up, accompanied by one of his caretakers.   His consult was for hypercalcemia, prediabetes, hypocortisolemia.  His repeat labs are revised and showing normalized calcium, cortisol.  He feels better, has no new complaints today.   He is a suboptimal historian. He is at  assisted living due to multiple medical problems including ataxia, hypertension, tachyarrhythmia, gouty arthritis, history of alcohol dependence, protein calorie malnutrition, history of falls. His medication list includes Megace, vitamin B12, calcium/vitamin D, thiamine, metoprolol. Review of his medical records shows supraphysiologic levels of vitamin B12, hypercalcemia, hypocortisolemia, and prediabetes with A1c of 6%.  His most recent labs show calcium 9.7, PTH 19. He denies pain, nausea/vomiting. He relates with a walker.     Review of Systems  Constitutional: + Fluctuating body weight, denies fatigue, no subjective hyperthermia, no subjective hypothermia Eyes: no blurry vision, no xerophthalmia  Objective:       01/07/2022    1:02 PM 12/07/2021    6:07 AM 12/06/2021    9:31 PM  Vitals with BMI  Height _0     Weight 141 lbs 6 oz    BMI 90.24    Systolic 097 353 299  Diastolic 86 81 80  Pulse 76 86 92    BP (!) 146/86   Pulse 76   Ht _1  (1.753 m)   Wt 141 lb 6.4 oz (64.1 kg)   BMI 20.88 kg/m   Wt Readings from Last 3 Encounters:  01/07/22 141 lb 6.4 oz (64.1 kg)  12/06/21 134 lb 7.7 oz (61 kg)  10/02/21 147 lb 11.3 oz (67 kg)    Physical Exam  Constitutional:  Body mass index is 20.88 kg/m.,  not in acute distress, normal state of mind, + cognitive deficit.  Chronically sick looking. Eyes: PERRLA, EOMI, no exophthalmos  CMP ( most recent) CMP      Lab Results  Component Value Date   TSH 1.182 03/17/2017   TSH 1.411 03/16/2017    Recent Results (from the past 2160 hour(s))  CBC with Differential/Platelet     Status: Abnormal   Collection Time: 12/06/21  3:43 AM  Result Value Ref Range   WBC 7.6 4.0 - 10.5 K/uL   RBC 4.55 4.22 - 5.81 MIL/uL   Hemoglobin 11.5 (L) 13.0 - 17.0 g/dL   HCT 36.4 (L) 39.0 - 52.0 %   MCV 80.0 80.0 - 100.0 fL   MCH 25.3 (L) 26.0 - 34.0 pg   MCHC 31.6 30.0 - 36.0 g/dL   RDW 17.6 (H) 11.5 - 15.5 %   Platelets 305 150 -  400 K/uL   nRBC 0.0 0.0 - 0.2 %   Neutrophils Relative % 59 %   Neutro Abs 4.5 1.7 - 7.7 K/uL   Lymphocytes Relative 24 %   Lymphs Abs 1.8 0.7 - 4.0 K/uL   Monocytes Relative 7 %   Monocytes Absolute 0.6 0.1 - 1.0 K/uL   Eosinophils Relative 8 %   Eosinophils Absolute 0.6 (H)  0.0 - 0.5 K/uL   Basophils Relative 1 %   Basophils Absolute 0.1 0.0 - 0.1 K/uL   Immature Granulocytes 1 %   Abs Immature Granulocytes 0.06 0.00 - 0.07 K/uL    Comment: Performed at [facility], [address], [place], [place] [address]  Comprehensive metabolic panel     Status: Abnormal   Collection Time: 12/06/21  3:43 AM  Result Value Ref Range   Sodium 130 (L) 135 - 145 mmol/L   Potassium 4.1 3.5 - 5.1 mmol/L   Chloride 97 (L) 98 - 111 mmol/L   CO2 24 22 - 32 mmol/L   Glucose, Bld 156 (H) 70 - 99 mg/dL    Comment: Glucose reference range applies only to samples taken after fasting for at least 8 hours.   BUN 22 8 - 23 mg/dL   Creatinine, Ser 1.60 (H) 0.61 - 1.24 mg/dL   Calcium 9.1 8.9 - 10.3 mg/dL   Total Protein 8.5 (H) 6.5 - 8.1 g/dL   Albumin 3.5 3.5 - 5.0 g/dL   AST 17 15 - 41 U/L   ALT 10 0 - 44 U/L   Alkaline Phosphatase 86 38 - 126 U/L   Total Bilirubin 0.6 0.3 - 1.2 mg/dL   GFR, Estimated 44 (L) >60 mL/min    Comment: (NOTE) Calculated using the CKD-EPI Creatinine Equation (2021)    Anion gap 9 5 - 15    Comment: Performed at [facility], [address] [place], [place] [address]  Lipase, blood     Status: None   Collection Time: 12/06/21  3:43 AM  Result Value Ref Range   Lipase 39 11 - 51 U/L    Comment: Performed at [facility], [address] [place] [address], [place], [place] [address]  Lactic acid, plasma     Status: Abnormal   Collection Time: 12/06/21  3:43 AM  Result Value Ref Range   Lactic Acid, Venous 2.5 (HH) 0.5 - 1.9 mmol/L    Comment: CRITICAL RESULT CALLED TO, READ BACK BY AND VERIFIED WITH: TRAYER,E AT 4:25AM ON 12/06/21 BY [facility] Performed at [facility], [address], [place], [place] [address]   Troponin I (High Sensitivity)     Status: None   Collection Time: 12/06/21  3:43 AM  Result Value Ref Range   Troponin I (High Sensitivity) 5 <18 ng/L    Comment: (NOTE) Elevated high sensitivity troponin I (hsTnI) values and significant  changes across serial measurements may suggest ACS but many other  chronic and acute conditions are known to elevate hsTnI results.  Refer to the "Links" section for chest pain algorithms and additional  guidance. Performed at [facility], [address] [place], [place] [address]   Brain natriuretic peptide     Status: None   Collection Time: 12/06/21  3:43 AM  Result Value Ref Range   B Natriuretic Peptide 56.0 0.0 - 100.0 pg/mL    Comment: Performed at [facility], [address] [place], [place] [address]  Ethanol     Status: None   Collection Time: 12/06/21  3:43 AM  Result Value Ref Range   Alcohol, Ethyl (B) <10 <10 mg/dL    Comment: (NOTE) Lowest detectable limit for serum alcohol is 10 mg/dL.  For medical purposes only. Performed at [facility], [address], [place], [place] [address]   Ammonia     Status: None   Collection Time: 12/06/21  3:43 AM  Result Value Ref Range   Ammonia 13 9 -  35 umol/L    Comment: Performed at [facility], [address], [place], [place] [address]  Protime-INR     Status: None   Collection Time: 12/06/21  3:43 AM  Result Value Ref Range   Prothrombin Time 14.4 11.4 - 15.2 seconds   INR 1.1 0.8 - 1.2    Comment: (NOTE) INR goal varies based on device and disease states. Performed at [facility], [address] [place], [place] [address]   Type and screen     Status: None   Collection Time: 12/06/21  3:43 AM  Result Value Ref Range   ABO/RH(D) B POS    Antibody Screen NEG    Sample Expiration      12/09/2021,2359 Performed at [facility], [address], [place], [place] [address]   ABO/Rh     Status: None   Collection Time: 12/06/21  5:21 AM  Result Value  Ref Range   ABO/RH(D)      B POS Performed at [facility], [address] [place], [place] [address]   Troponin I (High Sensitivity)     Status: None   Collection Time: 12/06/21  5:21 AM  Result Value Ref Range   Troponin I (High Sensitivity) 5 <18 ng/L    Comment: (NOTE) Elevated high sensitivity troponin I (hsTnI) values and significant  changes across serial measurements may suggest ACS but many other  chronic and acute conditions are known to elevate hsTnI results.  Refer to the "Links" section for chest pain algorithms and additional  guidance. Performed at [facility], [address] [place], [place] [address]   MRSA Next Gen by PCR, Nasal     Status: Abnormal   Collection Time: 12/06/21  8:22 AM   Specimen: Nasal Mucosa; Nasal Swab  Result Value Ref Range   MRSA by PCR Next Gen DETECTED (A) NOT DETECTED    Comment: RESULT CALLED TO, READ BACK BY AND VERIFIED WITH: BRITTANY FOLEY,RN _0  BY SSLANE (NOTE) The GeneXpert MRSA Assay (FDA approved for NASAL specimens only), is one component of a comprehensive MRSA colonization surveillance program. It is not intended to diagnose MRSA infection nor to guide or monitor treatment for MRSA infections. Test performance is not FDA approved in patients less than 1 years old. Performed at [facility], [address] [place], [place] [address]   Lactic acid, plasma     Status: Abnormal   Collection Time: 12/06/21  8:34 AM  Result Value Ref Range   Lactic Acid, Venous 2.2 (HH) 0.5 - 1.9 mmol/L    Comment: CRITICAL VALUE NOTED.  VALUE IS CONSISTENT WITH PREVIOUSLY REPORTED AND CALLED VALUE. Performed at [facility], [address], [place], [place] [address]   Lactic acid, plasma     Status: None   Collection Time: 12/06/21 12:35 PM  Result Value Ref Range   Lactic Acid, Venous 1.5 0.5 - 1.9 mmol/L    Comment: Performed at [facility], [address], [place], [place] [address]  PTH, intact and calcium     Status: None    Collection Time: 12/31/21 12:17 PM  Result Value Ref Range   PTH 19 15 - 65 pg/mL   PTH Interp Comment     Comment: Interpretation                 Intact PTH    Calcium                                 (  pg/mL)      (mg/dL) Normal                          15 - 65     8.6 - 10.2 Primary Hyperparathyroidism         >65          >10.2 Secondary Hyperparathyroidism       >65          <10.2 Non-Parathyroid Hypercalcemia       <65          >10.2 Hypoparathyroidism                  <15          < 8.6 Non-Parathyroid Hypocalcemia    15 - 65          < 8.6   Magnesium     Status: None   Collection Time: 12/31/21 12:17 PM  Result Value Ref Range   Magnesium 1.7 1.6 - 2.3 mg/dL  Phosphorus     Status: None   Collection Time: 12/31/21 12:17 PM  Result Value Ref Range   Phosphorus 3.2 2.8 - 4.1 mg/dL  Comprehensive metabolic panel     Status: Abnormal   Collection Time: 12/31/21 12:17 PM  Result Value Ref Range   Glucose 107 (H) 70 - 99 mg/dL   BUN 21 8 - 27 mg/dL   Creatinine, Ser 1.76 (H) 0.76 - 1.27 mg/dL   eGFR 39 (L) >59 mL/min/1.73   BUN/Creatinine Ratio 12 10 - 24   Sodium 133 (L) 134 - 144 mmol/L   Potassium 5.2 3.5 - 5.2 mmol/L   Chloride 96 96 - 106 mmol/L   CO2 24 20 - 29 mmol/L   Calcium 9.7 8.6 - 10.2 mg/dL   Total Protein 8.8 (H) 6.0 - 8.5 g/dL   Albumin 3.9 3.8 - 4.8 g/dL   Globulin, Total 4.9 (H) 1.5 - 4.5 g/dL   Albumin/Globulin Ratio 0.8 (L) 1.2 - 2.2   Bilirubin Total 0.4 0.0 - 1.2 mg/dL   Alkaline Phosphatase 108 44 - 121 IU/L   AST 14 0 - 40 IU/L   ALT 8 0 - 44 IU/L         Latest Reference Range & Units Most Recent  Cortisol - AM 6.2 - 19.4 ug/dL 14.1 08/20/21 08:50  Glucose 70 - 99 mg/dL 79 08/20/21 08:50  PTH, Intact 15 - 65 pg/mL 9 (L) 08/20/21 08:50  PTH-related peptide pmol/L <2.0 08/20/21 08:50  PTH Interp  Comment 08/20/21 08:50  (L): Data is abnormally low  Assessment & Plan:   1. Hypercalcemia-resolved 2.  Prediabetes -stable 3.   Hypocortisolemia-resolved I discussed his new results with him.  He does not have hypocalcemia nor adrenal insufficiency. -He will not need immediate intervention. -Some of his problems are associated with medications.  He is advised to discontinue vitamin B12, Megace, and calcium supplements   -For his prediabetes, I advised dietary adjustment with less consumption of processed carbs. In light of low PTH at 19 associated with normal calcium of 9.7 primary hyperparathyroidism seems to be unlikely.  His PTH related peptide is undetectable.     Immobilization hypercalcemia is not ruled out.  - I did not initiate any new prescriptions today.  Patient with multiple medical problems including cognitive deficit, history of alcohol abuse, poor dentition.  He will benefit from evaluation by a dentist. - he is advised to maintain close  follow up with Carrolyn Meiers, MD for primary care needs.   I spent 21 minutes in the care of the patient today including review of labs from Thyroid Function, CMP, and other relevant labs ; imaging/biopsy records (current and previous including abstractions from other facilities); face-to-face time discussing  his lab results and symptoms, medications doses, his options of short and long term treatment based on the latest standards of care / guidelines;   and documenting the encounter.  Anthony Burton  participated in the discussions, expressed understanding, and voiced agreement with the above plans.  All questions were answered to his satisfaction. he is encouraged to contact clinic should he have any questions or concerns prior to his return visit.    Follow up plan: Return if symptoms worsen or fail to improve.   Glade Lloyd, MD [facility] [address] [place], [place] [address] Phone: [phone]  Fax: [phone]     01/07/2022, 6:16 PM  This note was partially dictated with voice  recognition software. Similar sounding words can be transcribed inadequately or may not  be corrected upon review.

## 2022-02-11 DEATH — deceased

## 2023-02-09 IMAGING — DX DG CHEST 2V
2 series · 2 of 2 positions shown · non-contrast
Comparison: March 19, 2017

CLINICAL DATA: Stage III B chronic kidney disease and anemia.

EXAM:
CHEST - 2 VIEW

[chest pa]
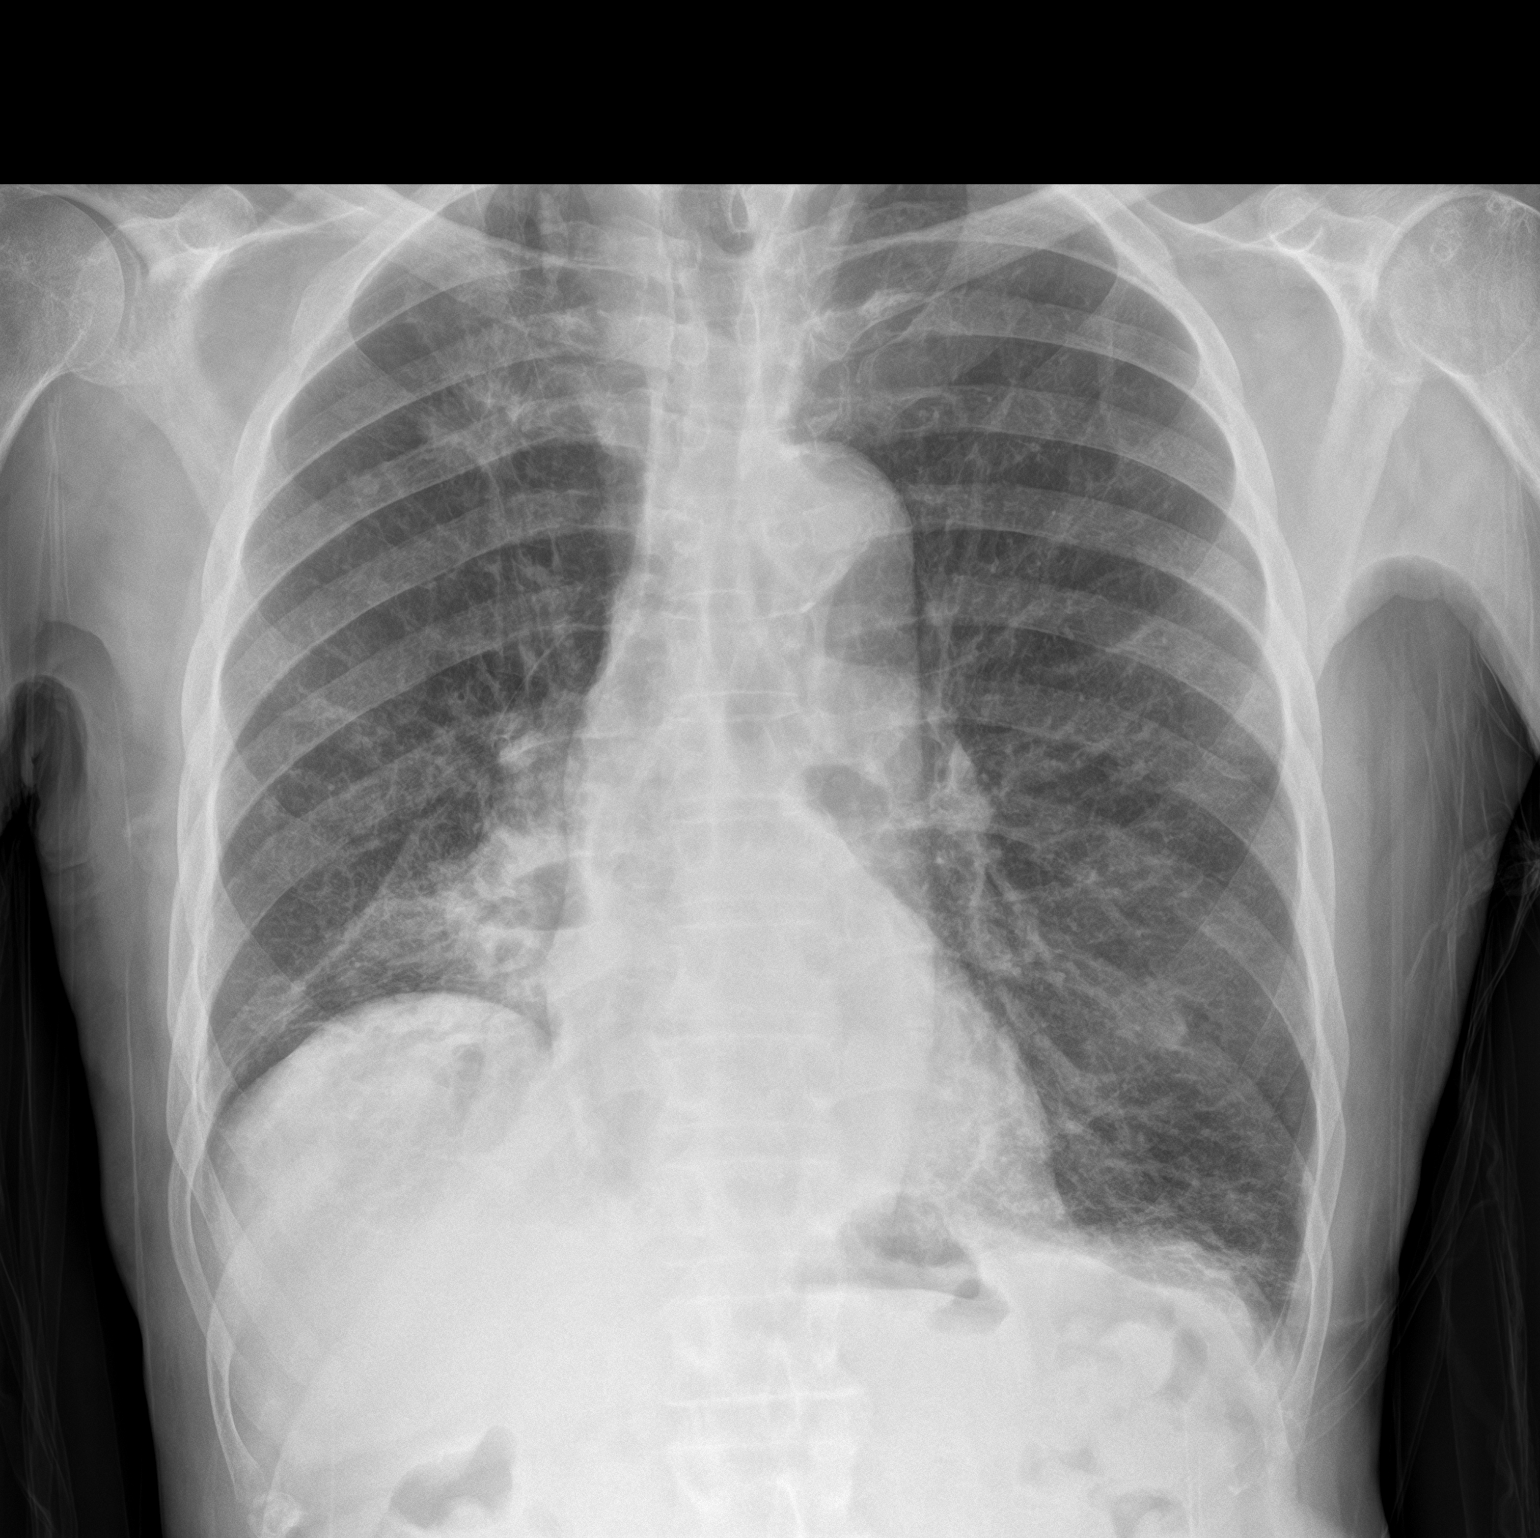

[chest lat]
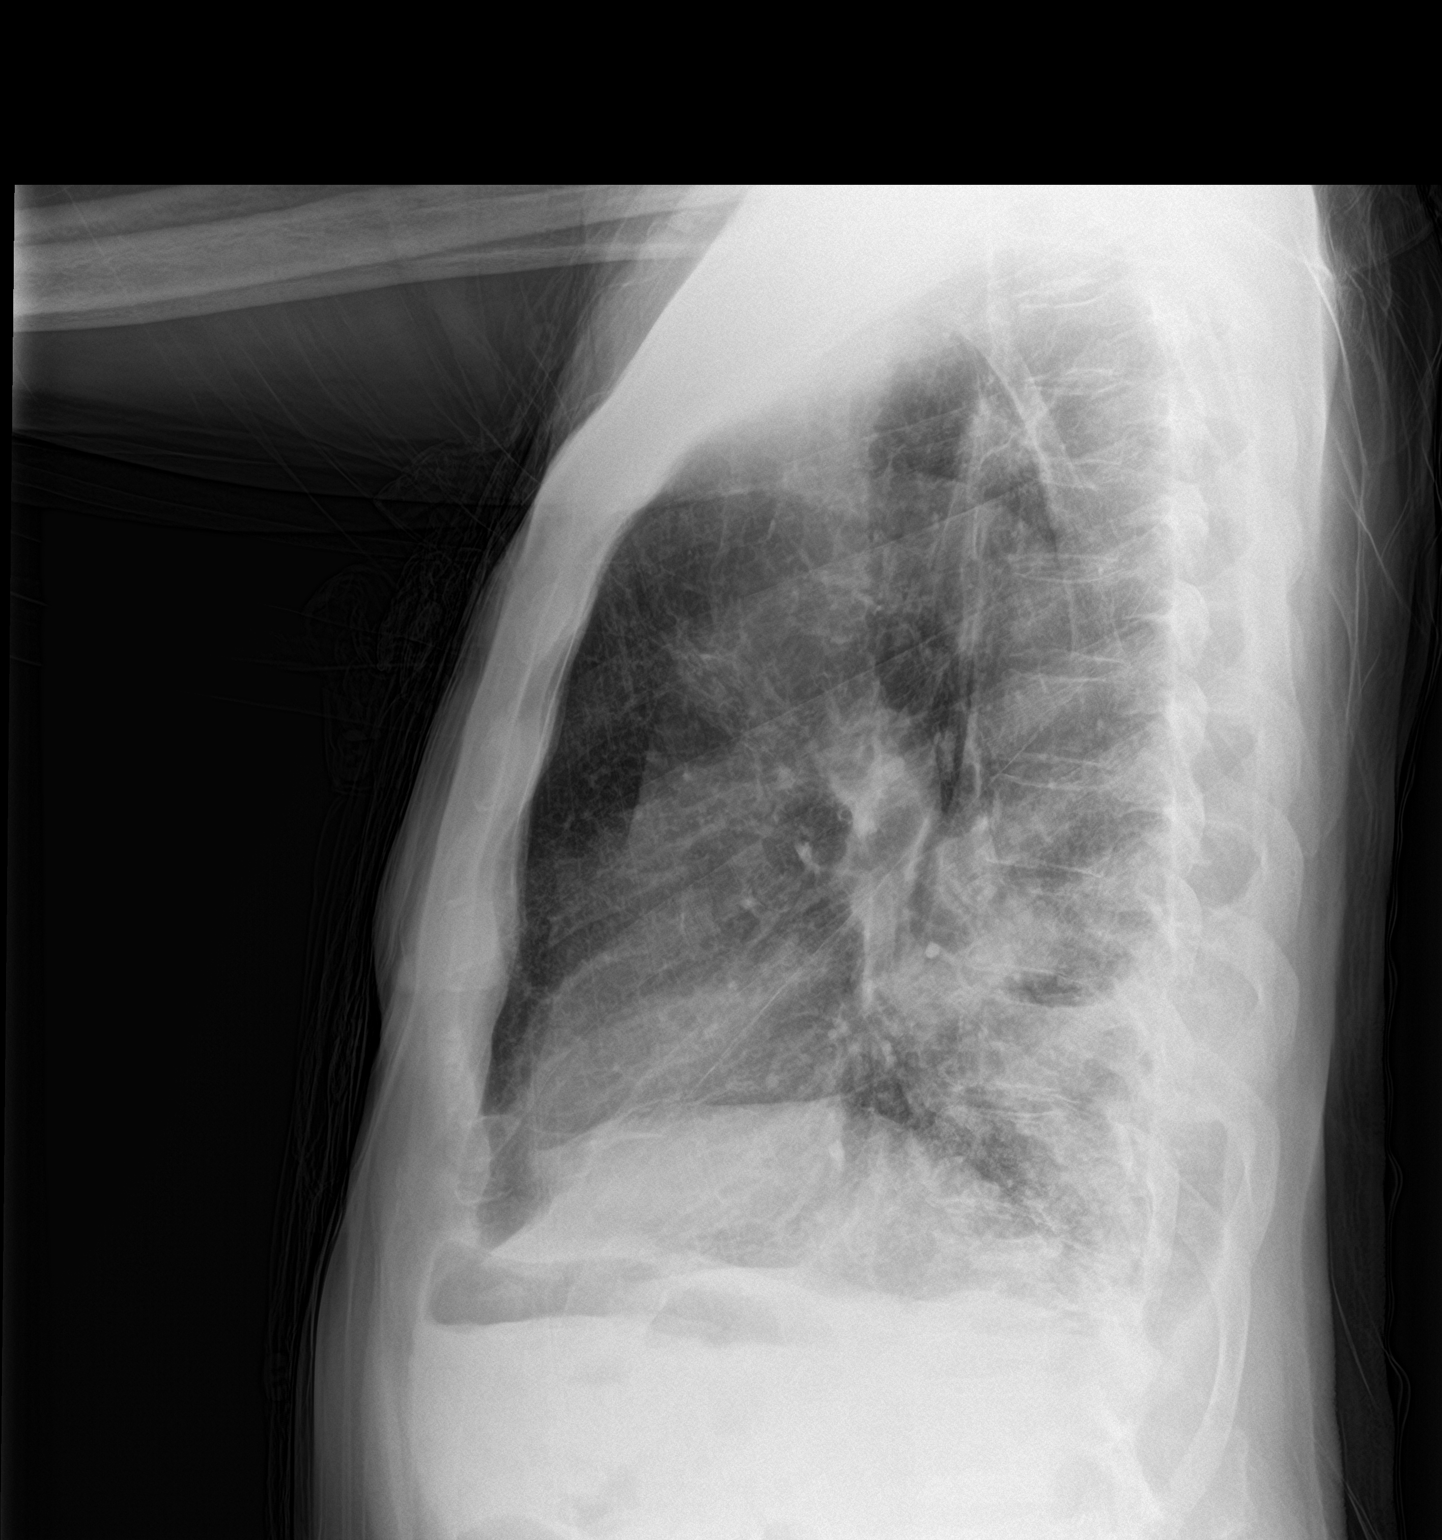

[2 of 2 positions shown; findings below may reference images not displayed]

FINDINGS: The right-sided PICC line seen on the prior study has been removed.
There is elevation of the right hemidiaphragm with subsequent
right-sided volume loss. Mild, diffuse, chronic appearing increased
interstitial lung markings are seen. Mild atelectasis is noted
within the bilateral lung bases. A small area of atelectasis and/or
infiltrate is seen within the right upper lobe. There is no evidence
of a pleural effusion or pneumothorax. The heart size and
mediastinal contours are within normal limits. The visualized
skeletal structures are unremarkable.
IMPRESSION: 1. Chronic appearing increased interstitial lung markings with mild
bibasilar atelectasis.
2. Mild right upper lobe atelectasis and/or infiltrate.
